# Patient Record
Sex: Female | Born: 1990 | Race: White | Hispanic: No | Marital: Married | Smoking: Never smoker
Health system: Southern US, Community
[De-identification: ages and names within clinical notes are randomized; demographics above are authoritative.]

## PROBLEM LIST (undated history)

## (undated) DIAGNOSIS — K219 Gastro-esophageal reflux disease without esophagitis: Secondary | ICD-10-CM

## (undated) DIAGNOSIS — E781 Pure hyperglyceridemia: Secondary | ICD-10-CM

## (undated) DIAGNOSIS — B019 Varicella without complication: Secondary | ICD-10-CM

## (undated) DIAGNOSIS — B86 Scabies: Secondary | ICD-10-CM

## (undated) DIAGNOSIS — E559 Vitamin D deficiency, unspecified: Secondary | ICD-10-CM

## (undated) DIAGNOSIS — N939 Abnormal uterine and vaginal bleeding, unspecified: Secondary | ICD-10-CM

## (undated) DIAGNOSIS — L0293 Carbuncle, unspecified: Secondary | ICD-10-CM

## (undated) HISTORY — DX: Vitamin D deficiency, unspecified: E55.9

## (undated) HISTORY — PX: NO PAST SURGERIES: SHX2092

## (undated) HISTORY — DX: Morbid (severe) obesity due to excess calories: E66.01

## (undated) HISTORY — DX: Abnormal uterine and vaginal bleeding, unspecified: N93.9

## (undated) HISTORY — DX: Pure hyperglyceridemia: E78.1

## (undated) HISTORY — DX: Carbuncle, unspecified: L02.93

## (undated) HISTORY — DX: Varicella without complication: B01.9

## (undated) HISTORY — DX: Gastro-esophageal reflux disease without esophagitis: K21.9

## (undated) HISTORY — DX: Scabies: B86

## (undated) HISTORY — PX: WISDOM TOOTH EXTRACTION: SHX21

---
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
## 2009-08-01 ENCOUNTER — Emergency Department (HOSPITAL_COMMUNITY): Admission: EM | Admit: 2009-08-01 | Discharge: 2009-08-01 | Payer: Self-pay | Admitting: Emergency Medicine

## 2009-08-02 ENCOUNTER — Ambulatory Visit: Payer: Self-pay | Admitting: Vascular Surgery

## 2009-08-02 ENCOUNTER — Encounter (INDEPENDENT_AMBULATORY_CARE_PROVIDER_SITE_OTHER): Payer: Self-pay | Admitting: Emergency Medicine

## 2009-08-02 ENCOUNTER — Emergency Department (HOSPITAL_COMMUNITY): Admission: EM | Admit: 2009-08-02 | Discharge: 2009-08-02 | Payer: Self-pay | Admitting: Emergency Medicine

## 2009-12-03 ENCOUNTER — Emergency Department (HOSPITAL_COMMUNITY): Admission: EM | Admit: 2009-12-03 | Discharge: 2009-12-03 | Payer: Self-pay | Admitting: Family Medicine

## 2009-12-29 ENCOUNTER — Emergency Department (HOSPITAL_COMMUNITY): Admission: EM | Admit: 2009-12-29 | Discharge: 2009-12-29 | Payer: Self-pay | Admitting: Emergency Medicine

## 2010-01-14 ENCOUNTER — Emergency Department (HOSPITAL_COMMUNITY): Admission: EM | Admit: 2010-01-14 | Discharge: 2010-01-14 | Payer: Self-pay | Admitting: Emergency Medicine

## 2010-06-30 ENCOUNTER — Encounter: Payer: Self-pay | Admitting: Orthopaedic Surgery

## 2010-08-23 LAB — DIFFERENTIAL
Eosinophils Absolute: 0.1 10*3/uL (ref 0.0–0.7)
Lymphs Abs: 2.2 10*3/uL (ref 0.7–4.0)
Neutro Abs: 3 10*3/uL (ref 1.7–7.7)
Neutrophils Relative %: 53 % (ref 43–77)

## 2010-08-23 LAB — CBC
HCT: 36 % (ref 36.0–46.0)
Hemoglobin: 11.8 g/dL — ABNORMAL LOW (ref 12.0–15.0)
MCH: 28 pg (ref 26.0–34.0)
MCHC: 32.8 g/dL (ref 30.0–36.0)
MCV: 85.5 fL (ref 78.0–100.0)

## 2010-08-23 LAB — COMPREHENSIVE METABOLIC PANEL
ALT: 14 U/L (ref 0–35)
BUN: 5 mg/dL — ABNORMAL LOW (ref 6–23)
CO2: 27 mEq/L (ref 19–32)
Calcium: 9.1 mg/dL (ref 8.4–10.5)
Creatinine, Ser: 0.52 mg/dL (ref 0.4–1.2)
GFR calc non Af Amer: >60 mL/min (ref >60)
Glucose, Bld: 95 mg/dL (ref 70–99)
Sodium: 139 mEq/L (ref 135–145)

## 2010-08-23 LAB — URINALYSIS, ROUTINE W REFLEX MICROSCOPIC
Bilirubin Urine: NEGATIVE
Glucose, UA: NEGATIVE mg/dL
Hgb urine dipstick: NEGATIVE
Ketones, ur: NEGATIVE mg/dL
Protein, ur: NEGATIVE mg/dL
pH: 7.5 (ref 5.0–8.0)

## 2010-08-23 LAB — LIPASE, BLOOD: Lipase: 23 U/L (ref 11–59)

## 2010-08-23 LAB — POCT PREGNANCY, URINE: Preg Test, Ur: NEGATIVE

## 2010-08-24 LAB — POCT PREGNANCY, URINE: Preg Test, Ur: NEGATIVE

## 2010-08-24 LAB — POCT I-STAT, CHEM 8
BUN: 8 mg/dL (ref 6–23)
Chloride: 104 mEq/L (ref 96–112)
Sodium: 141 mEq/L (ref 135–145)
TCO2: 27 mmol/L (ref 0–100)

## 2010-08-24 LAB — CBC
MCH: 29.2 pg (ref 26.0–34.0)
MCHC: 34.1 g/dL (ref 30.0–36.0)
Platelets: 268 10*3/uL (ref 150–400)

## 2010-08-24 LAB — LIPASE, BLOOD: Lipase: 26 U/L (ref 11–59)

## 2010-08-24 LAB — URINALYSIS, ROUTINE W REFLEX MICROSCOPIC
Bilirubin Urine: NEGATIVE
Ketones, ur: NEGATIVE mg/dL
Nitrite: NEGATIVE
Specific Gravity, Urine: 1.016 (ref 1.005–1.030)
Urobilinogen, UA: 0.2 mg/dL (ref 0.0–1.0)

## 2010-08-24 LAB — HEPATIC FUNCTION PANEL
Albumin: 3.8 g/dL (ref 3.5–5.2)
Total Protein: 6.6 g/dL (ref 6.0–8.3)

## 2010-08-24 LAB — DIFFERENTIAL
Basophils Absolute: 0 10*3/uL (ref 0.0–0.1)
Basophils Relative: 1 % (ref 0–1)
Eosinophils Absolute: 0.1 10*3/uL (ref 0.0–0.7)
Neutrophils Relative %: 41 % — ABNORMAL LOW (ref 43–77)

## 2010-08-25 LAB — POCT URINALYSIS DIP (DEVICE)
Bilirubin Urine: NEGATIVE
Ketones, ur: NEGATIVE mg/dL
Protein, ur: NEGATIVE mg/dL

## 2010-08-25 LAB — POCT PREGNANCY, URINE: Preg Test, Ur: NEGATIVE

## 2010-08-30 LAB — DIFFERENTIAL
Basophils Absolute: 0 10*3/uL (ref 0.0–0.1)
Basophils Relative: 0 % (ref 0–1)
Eosinophils Absolute: 0 10*3/uL (ref 0.0–0.7)
Eosinophils Relative: 0 % (ref 0–5)
Monocytes Absolute: 0.5 10*3/uL (ref 0.1–1.0)
Neutro Abs: 7.1 10*3/uL (ref 1.7–7.7)

## 2010-08-30 LAB — URINALYSIS, ROUTINE W REFLEX MICROSCOPIC
Bilirubin Urine: NEGATIVE
Bilirubin Urine: NEGATIVE
Hgb urine dipstick: NEGATIVE
Ketones, ur: NEGATIVE mg/dL
Nitrite: NEGATIVE
Nitrite: NEGATIVE
Specific Gravity, Urine: 1.013 (ref 1.005–1.030)
Urobilinogen, UA: 0.2 mg/dL (ref 0.0–1.0)
Urobilinogen, UA: 1 mg/dL (ref 0.0–1.0)
pH: 7.5 (ref 5.0–8.0)

## 2010-08-30 LAB — BASIC METABOLIC PANEL
CO2: 27 mEq/L (ref 19–32)
Calcium: 9.6 mg/dL (ref 8.4–10.5)
Chloride: 106 mEq/L (ref 96–112)
Glucose, Bld: 86 mg/dL (ref 70–99)
Sodium: 139 mEq/L (ref 135–145)

## 2010-08-30 LAB — CBC
HCT: 36.5 % (ref 36.0–46.0)
Hemoglobin: 12.1 g/dL (ref 12.0–15.0)
MCHC: 33.2 g/dL (ref 30.0–36.0)
MCV: 84.1 fL (ref 78.0–100.0)
RDW: 14.3 % (ref 11.5–15.5)

## 2010-08-30 LAB — D-DIMER, QUANTITATIVE: D-Dimer, Quant: <0.22 ug/mL-FEU (ref 0.00–0.48)

## 2010-08-30 LAB — URINE CULTURE
Colony Count: 80000
Colony Count: NO GROWTH

## 2010-08-30 LAB — RAPID URINE DRUG SCREEN, HOSP PERFORMED
Barbiturates: NOT DETECTED
Benzodiazepines: NOT DETECTED

## 2010-08-30 LAB — POCT CARDIAC MARKERS: Troponin i, poc: <0.05 ng/mL (ref 0.00–0.09)

## 2013-03-17 ENCOUNTER — Other Ambulatory Visit: Payer: Self-pay | Admitting: Family Medicine

## 2013-03-17 NOTE — Telephone Encounter (Signed)
Medication refilled per protocol. 

## 2013-04-12 ENCOUNTER — Other Ambulatory Visit: Payer: Self-pay | Admitting: Family Medicine

## 2013-05-23 ENCOUNTER — Other Ambulatory Visit: Payer: Self-pay | Admitting: Family Medicine

## 2013-05-24 NOTE — Telephone Encounter (Signed)
Medication refilled per protocol. 

## 2013-06-22 ENCOUNTER — Encounter: Payer: Self-pay | Admitting: Family Medicine

## 2013-06-22 ENCOUNTER — Ambulatory Visit (INDEPENDENT_AMBULATORY_CARE_PROVIDER_SITE_OTHER): Payer: BC Managed Care – PPO | Admitting: Family Medicine

## 2013-06-22 VITALS — BP 110/70 | HR 78 | Temp 98.0°F | Resp 20 | Ht 66.0 in | Wt 287.0 lb

## 2013-06-22 DIAGNOSIS — K219 Gastro-esophageal reflux disease without esophagitis: Secondary | ICD-10-CM

## 2013-06-22 DIAGNOSIS — Z309 Encounter for contraceptive management, unspecified: Secondary | ICD-10-CM

## 2013-06-22 HISTORY — DX: Morbid (severe) obesity due to excess calories: E66.01

## 2013-06-22 MED ORDER — NORGESTIM-ETH ESTRAD TRIPHASIC 0.18/0.215/0.25 MG-35 MCG PO TABS: ORAL_TABLET | ORAL | Status: DC

## 2013-06-22 MED ORDER — OMEPRAZOLE 40 MG PO CPDR: DELAYED_RELEASE_CAPSULE | ORAL | Status: DC

## 2013-06-22 NOTE — Patient Instructions (Signed)
Continue current medications F/U 4 weeks - For Physical PAP Smear/ Fasting

## 2013-06-22 NOTE — Progress Notes (Signed)
   Subjective:    Patient ID: Jenna Bailey, female    DOB: May 21, 1991, 23 y.o.   MRN: [id number]  HPI  Patient here to followup medications. She has no specific concerns today. She's on birth control as well as omeprazole for acid reflux. She does not have any difficulties with the medications. Her last Pap smear was greater than one year ago. She is sexually active with one partner. Her last menstrual period was one week ago   Review of Systems  GEN- denies fatigue, fever, weight loss,weakness, recent illness HEENT- denies eye drainage, change in vision, nasal discharge, CVS- denies chest pain, palpitations RESP- denies SOB, cough, wheeze Neuro- denies headache, dizziness, syncope, seizure activity      Objective:   Physical Exam GEN- NAD, alert and oriented x3, obese CVS- RRR, no murmur RESP-CTAB EXT- No edema Pulses- Radial 2+        Assessment & Plan:

## 2013-06-22 NOTE — Assessment & Plan Note (Signed)
Birth control refills. She will schedule physical with a Pap smear in 4 weeks time.

## 2013-06-22 NOTE — Assessment & Plan Note (Signed)
Continue omeprazole 

## 2013-07-20 ENCOUNTER — Other Ambulatory Visit: Payer: Self-pay | Admitting: Family Medicine

## 2013-07-20 ENCOUNTER — Encounter: Payer: Self-pay | Admitting: Family Medicine

## 2013-07-20 ENCOUNTER — Ambulatory Visit (INDEPENDENT_AMBULATORY_CARE_PROVIDER_SITE_OTHER): Payer: BC Managed Care – PPO | Admitting: Family Medicine

## 2013-07-20 VITALS — BP 110/70 | HR 90 | Temp 98.0°F | Resp 18 | Ht 65.5 in | Wt 288.0 lb

## 2013-07-20 DIAGNOSIS — Z113 Encounter for screening for infections with a predominantly sexual mode of transmission: Secondary | ICD-10-CM

## 2013-07-20 DIAGNOSIS — Z23 Encounter for immunization: Secondary | ICD-10-CM

## 2013-07-20 DIAGNOSIS — Z124 Encounter for screening for malignant neoplasm of cervix: Secondary | ICD-10-CM

## 2013-07-20 DIAGNOSIS — Z0000 Encounter for general adult medical examination without abnormal findings: Secondary | ICD-10-CM

## 2013-07-20 DIAGNOSIS — R51 Headache: Secondary | ICD-10-CM

## 2013-07-20 DIAGNOSIS — R519 Headache, unspecified: Secondary | ICD-10-CM

## 2013-07-20 LAB — CBC WITH DIFFERENTIAL/PLATELET
Basophils Absolute: 0 10*3/uL (ref 0.0–0.1)
Basophils Relative: 0 % (ref 0–1)
EOS ABS: 0.1 10*3/uL (ref 0.0–0.7)
Eosinophils Relative: 1 % (ref 0–5)
HEMATOCRIT: 36.5 % (ref 36.0–46.0)
HEMOGLOBIN: 12 g/dL (ref 12.0–15.0)
LYMPHS ABS: 3.1 10*3/uL (ref 0.7–4.0)
Lymphocytes Relative: 38 % (ref 12–46)
MCH: 26.4 pg (ref 26.0–34.0)
MCHC: 32.9 g/dL (ref 30.0–36.0)
MCV: 80.4 fL (ref 78.0–100.0)
MONO ABS: 0.5 10*3/uL (ref 0.1–1.0)
MONOS PCT: 6 % (ref 3–12)
NEUTROS PCT: 55 % (ref 43–77)
Neutro Abs: 4.4 10*3/uL (ref 1.7–7.7)
Platelets: 367 10*3/uL (ref 150–400)
RBC: 4.54 MIL/uL (ref 3.87–5.11)
RDW: 14.5 % (ref 11.5–15.5)
WBC: 8.1 10*3/uL (ref 4.0–10.5)

## 2013-07-20 LAB — COMPREHENSIVE METABOLIC PANEL
ALBUMIN: 3.7 g/dL (ref 3.5–5.2)
ALT: 18 U/L (ref 0–35)
AST: 16 U/L (ref 0–37)
Alkaline Phosphatase: 65 U/L (ref 39–117)
BILIRUBIN TOTAL: 0.4 mg/dL (ref 0.2–1.2)
BUN: 9 mg/dL (ref 6–23)
CO2: 27 meq/L (ref 19–32)
Calcium: 9.1 mg/dL (ref 8.4–10.5)
Chloride: 102 mEq/L (ref 96–112)
Creat: 0.57 mg/dL (ref 0.50–1.10)
GLUCOSE: 84 mg/dL (ref 70–99)
Potassium: 4.8 mEq/L (ref 3.5–5.3)
SODIUM: 137 meq/L (ref 135–145)
TOTAL PROTEIN: 6.6 g/dL (ref 6.0–8.3)

## 2013-07-20 LAB — LIPID PANEL
CHOLESTEROL: 177 mg/dL (ref 0–200)
HDL: 36 mg/dL — AB (ref >39)
LDL Cholesterol: 90 mg/dL (ref 0–99)
TRIGLYCERIDES: 254 mg/dL — AB (ref <150)
Total CHOL/HDL Ratio: 4.9 Ratio
VLDL: 51 mg/dL — AB (ref 0–40)

## 2013-07-20 LAB — RPR

## 2013-07-20 LAB — WET PREP FOR TRICH, YEAST, CLUE
Trich, Wet Prep: NONE SEEN
Yeast Wet Prep HPF POC: NONE SEEN

## 2013-07-20 LAB — HIV ANTIBODY (ROUTINE TESTING W REFLEX): HIV: NONREACTIVE

## 2013-07-20 NOTE — Patient Instructions (Signed)
I recommend eye visit once a year I recommend dental visit every 6 months Goal is to  Exercise 30 minutes 5 days a week We will send a letter with lab results  Okay to use Ibuprofen every 6 hours as needed for headache F/U in 1 year for physical and medications

## 2013-07-20 NOTE — Progress Notes (Signed)
Patient ID: Jenna Bailey, female   DOB: [birth year], 24 y.o.   MRN: [id number]     Subjective:    Patient ID: Jenna Bailey, female    DOB: [birth year], 23 y.o.   MRN: [id number]  Patient presents for Annual Exam and headaches   patient here for a no exam. Her last Pap smear was either one to 2 years ago was normal. Her last menstrual period was one week ago. She's currently on birth control and acid reflux medication. She's not had any difficulties with her menses. She agreed to STD check today. She has had headaches for the past week almost daily. She does take Tylenol which relieves the headache. She does have some mild nausea associated but no photophobia or phonophobia. 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 She's also tried to decrease her caffeine use. She's not exercising on a regular basis but has started walking some with her dog.    Review Of Systems:  GEN- denies fatigue, fever, weight loss,weakness, recent illness HEENT- denies eye drainage, change in vision, nasal discharge, CVS- denies chest pain, palpitations RESP- denies SOB, cough, wheeze ABD- denies N/V, change in stools, abd pain GU- denies dysuria, hematuria, dribbling, incontinence MSK- denies joint pain, muscle aches, injury Neuro- + headache, dizziness, syncope, seizure activity       Objective:    BP 110/70  Pulse 90  Temp(Src) 98 F (36.7 C) (Oral)  Resp 18  Ht 5' 5.5" (1.664 m)  Wt 288 lb (130.636 kg)  BMI 47.18 kg/m2  LMP 07/16/2013 GEN- NAD, alert and oriented x3, obese HEENT- PERRL, EOMI, non injected sclera, pink conjunctiva, MMM, oropharynx clear, fundus benign, TM clear bilat Neck- Supple, no thyromegaly Breast- normal symmetry, no nipple inversion,no nipple drainage, no nodules or lumps felt Nodes- no axillary nodes CVS- RRR, no murmur RESP-CTAB ABD-NABS,soft,NT,ND GU- normal external  genitalia, vaginal mucosa pink and moist, cervix visualized no growth, + blood form os, no discharge, no CMT, no ovarian masses, uterus normal size EXT- No edema Pulses- Radial, DP- 2+ Neuro-CNII-XII in tact, no deficits       Assessment & Plan:      Problem List Items Addressed This Visit   Morbid obesity - discussed importance of weight loss, healthy eating,    Relevant Orders      Lipid panel   Headache    Other Visit Diagnoses   Routine general medical examination at a health care facility    -  Primary  - PAP Smear done, TDAP given, STD screen    Relevant Orders       CBC with Differential       Comprehensive metabolic panel    Screening for malignant neoplasm of the cervix        Relevant Orders       PAP, ThinPrep ASCUS Rflx HPV Rflx Type    Screen for STD (sexually transmitted disease)        Relevant Orders       WET PREP FOR TRICH, YEAST, CLUE       GC/chlamydia probe amp, genital       HIV Antibody       RPR    Need for prophylactic vaccination with combined diphtheria-tetanus-pertussis (DTP) vaccine        Relevant Orders       Tdap vaccine greater than or equal to 7yo  IM (Completed)       Note: This dictation was prepared with Dragon dictation along with smaller phrase technology. Any transcriptional errors that result from this process are unintentional.

## 2013-07-21 LAB — PAP THINPREP ASCUS RFLX HPV RFLX TYPE

## 2013-07-21 LAB — GC/CHLAMYDIA PROBE AMP
CT Probe RNA: NEGATIVE
GC Probe RNA: NEGATIVE

## 2013-07-27 ENCOUNTER — Encounter: Payer: Self-pay | Admitting: *Deleted

## 2014-03-02 ENCOUNTER — Telehealth: Payer: Self-pay | Admitting: *Deleted

## 2014-03-02 NOTE — Telephone Encounter (Signed)
Received fax requesting PA on Prilosec.   PA submitted.

## 2014-03-08 NOTE — Telephone Encounter (Signed)
Received PA determination.   PA approved.   02/09/2014- 08/29/2014.  Case ID: [id number]

## 2014-06-16 ENCOUNTER — Other Ambulatory Visit: Payer: Self-pay | Admitting: Family Medicine

## 2014-06-16 NOTE — Telephone Encounter (Signed)
Refill denied.   Patient has been discharged from practice.

## 2014-06-26 ENCOUNTER — Other Ambulatory Visit: Payer: Self-pay | Admitting: Family Medicine

## 2014-06-26 NOTE — Telephone Encounter (Signed)
Refill appropriate and filled per protocol. 

## 2014-07-20 ENCOUNTER — Other Ambulatory Visit: Payer: Self-pay | Admitting: Family Medicine

## 2014-07-20 NOTE — Telephone Encounter (Signed)
Refill appropriate and filled per protocol. 

## 2014-07-21 ENCOUNTER — Encounter: Payer: BC Managed Care – PPO | Admitting: Family Medicine

## 2014-07-26 ENCOUNTER — Encounter: Payer: Self-pay | Admitting: Family Medicine

## 2014-07-26 ENCOUNTER — Ambulatory Visit (INDEPENDENT_AMBULATORY_CARE_PROVIDER_SITE_OTHER): Payer: BLUE CROSS/BLUE SHIELD | Admitting: Family Medicine

## 2014-07-26 VITALS — BP 124/70 | HR 78 | Temp 98.2°F | Resp 16 | Ht 66.0 in | Wt 310.0 lb

## 2014-07-26 DIAGNOSIS — Z0000 Encounter for general adult medical examination without abnormal findings: Secondary | ICD-10-CM

## 2014-07-26 DIAGNOSIS — Z124 Encounter for screening for malignant neoplasm of cervix: Secondary | ICD-10-CM

## 2014-07-26 DIAGNOSIS — L0293 Carbuncle, unspecified: Secondary | ICD-10-CM

## 2014-07-26 DIAGNOSIS — Z23 Encounter for immunization: Secondary | ICD-10-CM

## 2014-07-26 DIAGNOSIS — K219 Gastro-esophageal reflux disease without esophagitis: Secondary | ICD-10-CM

## 2014-07-26 DIAGNOSIS — L0292 Furuncle, unspecified: Secondary | ICD-10-CM

## 2014-07-26 DIAGNOSIS — Z3041 Encounter for surveillance of contraceptive pills: Secondary | ICD-10-CM

## 2014-07-26 HISTORY — DX: Furuncle, unspecified: L02.92

## 2014-07-26 HISTORY — DX: Carbuncle, unspecified: L02.93

## 2014-07-26 LAB — COMPREHENSIVE METABOLIC PANEL
ALBUMIN: 3.7 g/dL (ref 3.5–5.2)
ALK PHOS: 71 U/L (ref 39–117)
ALT: 19 U/L (ref 0–35)
AST: 18 U/L (ref 0–37)
BILIRUBIN TOTAL: 0.5 mg/dL (ref 0.2–1.2)
BUN: 8 mg/dL (ref 6–23)
CO2: 26 mEq/L (ref 19–32)
Calcium: 9 mg/dL (ref 8.4–10.5)
Chloride: 101 mEq/L (ref 96–112)
Creat: 0.54 mg/dL (ref 0.50–1.10)
GLUCOSE: 76 mg/dL (ref 70–99)
POTASSIUM: 4.3 meq/L (ref 3.5–5.3)
SODIUM: 139 meq/L (ref 135–145)
TOTAL PROTEIN: 6.8 g/dL (ref 6.0–8.3)

## 2014-07-26 LAB — CBC WITH DIFFERENTIAL/PLATELET
BASOS PCT: 0 % (ref 0–1)
Basophils Absolute: 0 10*3/uL (ref 0.0–0.1)
Eosinophils Absolute: 0.1 10*3/uL (ref 0.0–0.7)
Eosinophils Relative: 1 % (ref 0–5)
HEMATOCRIT: 37.1 % (ref 36.0–46.0)
Hemoglobin: 11.7 g/dL — ABNORMAL LOW (ref 12.0–15.0)
LYMPHS PCT: 39 % (ref 12–46)
Lymphs Abs: 3.7 10*3/uL (ref 0.7–4.0)
MCH: 24.6 pg — AB (ref 26.0–34.0)
MCHC: 31.5 g/dL (ref 30.0–36.0)
MCV: 78.1 fL (ref 78.0–100.0)
MONO ABS: 0.6 10*3/uL (ref 0.1–1.0)
MONOS PCT: 6 % (ref 3–12)
MPV: 8.9 fL (ref 8.6–12.4)
NEUTROS ABS: 5.1 10*3/uL (ref 1.7–7.7)
Neutrophils Relative %: 54 % (ref 43–77)
PLATELETS: 397 10*3/uL (ref 150–400)
RBC: 4.75 MIL/uL (ref 3.87–5.11)
RDW: 14.9 % (ref 11.5–15.5)
WBC: 9.5 10*3/uL (ref 4.0–10.5)

## 2014-07-26 LAB — LIPID PANEL
Cholesterol: 177 mg/dL (ref 0–200)
HDL: 41 mg/dL (ref >39)
LDL CALC: 81 mg/dL (ref 0–99)
TRIGLYCERIDES: 276 mg/dL — AB (ref <150)
Total CHOL/HDL Ratio: 4.3 Ratio
VLDL: 55 mg/dL — AB (ref 0–40)

## 2014-07-26 MED ORDER — SULFAMETHOXAZOLE-TRIMETHOPRIM 800-160 MG PO TABS: 1.0000 | ORAL_TABLET | Freq: Two times a day (BID) | ORAL | Status: DC

## 2014-07-26 NOTE — Addendum Note (Signed)
Addended by: Phillips OdorSIX, CHRISTINA H on: 07/26/2014 12:42 PM   Modules accepted: Orders

## 2014-07-26 NOTE — Patient Instructions (Addendum)
I recommend eye visit once a year I recommend dental visit every 6 months Goal is to  Exercise 30 minutes 5 days a week We will send a letter with lab results  If normal we will go 2-3 years before next PAP Smear Take antibiotics as prescribed for boils Use anti-bacterial soap F/U 1 year for medications/Physical

## 2014-07-26 NOTE — Assessment & Plan Note (Signed)
Bactrim DS x 7 days Change to antibacterial soap Decrease shaving in axilla Discussed back up method while on antibiotics for contraception

## 2014-07-26 NOTE — Progress Notes (Signed)
Patient ID: Jenna Bailey, female   DOB: 1990/09/07, 24 y.o.   MRN: [id number]   Subjective:    Patient ID: Jenna Bailey, female    DOB: 1990/09/07, 24 y.o.   MRN: [id number]  Patient presents for CPE- no PAP and Discuss Boils  patient here for physical exam. She is concerned about recurrent boils that she's been getting beneath her axilla. She's also had a few on her abdomen in her breast region. She's tried changing to NATURAL soaps with no improvement. Since our last visit she is actually gained 22 pounds. She states that she was eating a lot when she lost her job there was a lot of stressors at home. Note she was recently married in October. She is taking her birth control as prescribed as well as her omeprazole. She would like to have a flu shot today. She is due for fasting labs. She did have a Pap smear last year which showed benign repaired of changes her last initial. Started last Thursday    Review Of Systems:  GEN- denies fatigue, fever, weight loss,weakness, recent illness HEENT- denies eye drainage, change in vision, nasal discharge, CVS- denies chest pain, palpitations RESP- denies SOB, cough, wheeze ABD- denies N/V, change in stools, abd pain GU- denies dysuria, hematuria, dribbling, incontinence MSK- denies joint pain, muscle aches, injury Neuro- + occasional  headache, dizziness, syncope, seizure activity       Objective:    BP 124/70 mmHg  Pulse 78  Temp(Src) 98.2 F (36.8 C) (Oral)  Resp 16  Ht 5\' 6"  (1.676 m)  Wt 310 lb (140.615 kg)  BMI 50.06 kg/m2  LMP 07/20/2014 (Approximate) GEN- NAD, alert and oriented x3,obese HEENT- PERRL, EOMI, non injected sclera, pink conjunctiva, MMM, oropharynx clear, TM clear bilat, wears glasses Neck- Supple, no thyromegaly Breast- normal symmetry, no nipple inversion,no nipple drainage, no nodules or lumps felt Nodes- no axillary nodes CVS- RRR, no murmur RESP-CTAB ABD-NABS,soft,NT,ND GU- normal external genitalia,  vaginal mucosa pink and moist, cervix visualized no growth, + blood form os, minimal thin clear discharge, no CMT, no ovarian masses, uterus difficult to palpate entirely due to habitus Skin- multiple small pustules bilat axilla, small boil < dime size below left axilla, , erythematous area with scab at center on right breast 7 o'clock position, no indurated or large fluctuant lesions, scarbs on abdomen, buttocks EXT- No edema Pulses- Radial, DP- 2+        Assessment & Plan:      Problem List Items Addressed This Visit      Unprioritized   Recurrent boils   Relevant Medications   sulfamethoxazole-trimethoprim (BACTRIM/SEPTRA DS) tablet 800-160 mg   Morbid obesity - Primary   GERD (gastroesophageal reflux disease)   Contraceptive management    Other Visit Diagnoses    Routine general medical examination at a health care facility        PAP done, if normal, will 2 wnl , repeat in 3 years, Flu shot, fasting labs, discussed weigth at length and concern for DM, HTN, CAD    Relevant Orders    CBC with Differential/Platelet    Comprehensive metabolic panel    Lipid panel    Papanicolaou smear for cervical cancer screening        Relevant Orders    PAP, ThinPrep ASCUS Rflx HPV Rflx Type       Note: This dictation was prepared with Dragon dictation along with smaller phrase technology. Any transcriptional errors that result from this process  are unintentional.

## 2014-07-26 NOTE — Assessment & Plan Note (Signed)
Continue OCP

## 2014-07-26 NOTE — Assessment & Plan Note (Signed)
Well controlled on PPI continue, weight loss and dietary change will help

## 2014-07-27 LAB — PAP THINPREP ASCUS RFLX HPV RFLX TYPE

## 2014-07-28 ENCOUNTER — Other Ambulatory Visit: Payer: Self-pay | Admitting: *Deleted

## 2014-07-28 DIAGNOSIS — E785 Hyperlipidemia, unspecified: Secondary | ICD-10-CM

## 2014-09-12 ENCOUNTER — Other Ambulatory Visit: Payer: Self-pay | Admitting: Family Medicine

## 2014-09-13 NOTE — Telephone Encounter (Signed)
Refill appropriate and filled per protocol. 

## 2014-09-27 ENCOUNTER — Telehealth: Payer: Self-pay | Admitting: Family Medicine

## 2014-09-27 NOTE — Telephone Encounter (Signed)
Patient is calling to see what the status of her omeprazole authorization is , please call her at [phone]

## 2014-09-28 NOTE — Telephone Encounter (Signed)
Attempting PA for Omeprazole.   Need correct pharmacy payer information.   Call placed to patient. LMTRC.

## 2014-09-28 NOTE — Telephone Encounter (Signed)
Pt called back and stated her pharmacy is [facility] [address]

## 2014-09-29 NOTE — Telephone Encounter (Signed)
Call placed to patient.   Repots that she got married and changed her name on her insurance. States that she did receive a new card from [facility]. States that she will bring it in office later in the day.

## 2014-10-03 NOTE — Telephone Encounter (Signed)
PA Submitted

## 2014-10-04 NOTE — Telephone Encounter (Signed)
Received PA determination.   PA approved 09/12/2014- 04/01/2015.  Case ID: [id number].

## 2014-11-12 ENCOUNTER — Encounter (HOSPITAL_COMMUNITY): Payer: Self-pay | Admitting: Emergency Medicine

## 2014-11-12 ENCOUNTER — Emergency Department (HOSPITAL_COMMUNITY)
Admission: EM | Admit: 2014-11-12 | Discharge: 2014-11-12 | Disposition: A | Discharge status: Home / Self Care | Payer: BLUE CROSS/BLUE SHIELD | Attending: Emergency Medicine | Admitting: Emergency Medicine

## 2014-11-12 DIAGNOSIS — L03116 Cellulitis of left lower limb: Secondary | ICD-10-CM | POA: Insufficient documentation

## 2014-11-12 DIAGNOSIS — Z79899 Other long term (current) drug therapy: Secondary | ICD-10-CM | POA: Insufficient documentation

## 2014-11-12 DIAGNOSIS — K219 Gastro-esophageal reflux disease without esophagitis: Secondary | ICD-10-CM | POA: Insufficient documentation

## 2014-11-12 MED ORDER — CEPHALEXIN 500 MG PO CAPS: 500.0000 mg | ORAL_CAPSULE | Freq: Four times a day (QID) | ORAL | Status: DC

## 2014-11-12 MED ORDER — SULFAMETHOXAZOLE-TRIMETHOPRIM 800-160 MG PO TABS
1.0000 | ORAL_TABLET | Freq: Two times a day (BID) | ORAL | Status: DC
Start: 2014-11-12 — End: 2015-03-07

## 2014-11-12 NOTE — ED Notes (Addendum)
Patient with boil on left lower leg, now with redness, warm to the touch, hurt to walk.  It is swollen.  Patient denies any long car rides or flights.

## 2014-11-12 NOTE — Discharge Instructions (Signed)

## 2014-11-12 NOTE — ED Provider Notes (Signed)
CSN: [id number]     Arrival date & time 11/12/14  1901 History  This chart was scribed for non-physician practitioner, Ladona Mow, PA-C,working with Mancel Bale, MD, by Karle Plumber, ED Scribe. This patient was seen in room TR06C/TR06C and the patient's care was started at 9:42 PM.  Chief Complaint  Patient presents with  . Abscess   Patient is a 24 y.o. female presenting with abscess. The history is provided by the patient and medical records. No language interpreter was used.  Abscess Associated symptoms: no fever, no nausea and no vomiting     HPI Comments:  ZAINEB NOWACZYK is a 24 y.o. female with PMHx of abscesses who presents to the Emergency Department complaining of an abscess to the left lateral leg that has been getting progressively worse that began four days ago. She reports associated drainage, swelling and redness of the area. She reports severe pain. She has been soaking the area in warm water with minimal relief. Denies modifying factors. Denies fever, chills, nausea or vomiting. She denies h/o DVT or PE.   Past Medical History  Diagnosis Date  . GERD (gastroesophageal reflux disease)    Past Surgical History  Procedure Laterality Date  . Wisdom tooth extraction     No family history on file. History  Substance Use Topics  . Smoking status: Never Smoker   . Smokeless tobacco: Never Used  . Alcohol Use: No   OB History    No data available     Review of Systems  Constitutional: Negative for fever and chills.  Gastrointestinal: Negative for nausea and vomiting.  Skin: Positive for color change.    Allergies  Review of patient's allergies indicates no known allergies.  Home Medications   Prior to Admission medications   Medication Sig Start Date End Date Taking? Authorizing Provider  cephALEXin (KEFLEX) 500 MG capsule Take 1 capsule (500 mg total) by mouth 4 (four) times daily. 11/12/14   Ladona Mow, PA-C  omeprazole (PRILOSEC) 40 MG capsule TAKE 1 CAPSULE BY  MOUTH EVERY DAY 07/20/14   Salley Scarlet, MD  sulfamethoxazole-trimethoprim (BACTRIM DS,SEPTRA DS) 800-160 MG per tablet Take 1 tablet by mouth 2 (two) times daily. 11/12/14   Ladona Mow, PA-C  TRINESSA, 28, 0.18/0.215/0.25 MG-35 MCG tablet TAKE 1 TABLET BY MOUTH DAILY 09/13/14   Salley Scarlet, MD   Triage Vitals: BP 120/85 mmHg  Pulse 90  Temp(Src) 98.1 F (36.7 C) (Oral)  Resp 14  SpO2 97%  LMP 10/22/2014 (Approximate) Physical Exam  Constitutional: She is oriented to person, place, and time. She appears well-developed and well-nourished.  HENT:  Head: Normocephalic and atraumatic.  Eyes: EOM are normal.  Neck: Normal range of motion.  Cardiovascular: Normal rate.   Pulmonary/Chest: Effort normal.  Musculoskeletal: Normal range of motion.  Neurological: She is alert and oriented to person, place, and time.  Skin: Skin is warm and dry.     Erythema and mild swelling noted to lateral, distal portion of tibial region on left leg. Erythema does not extend circumferentially. In the center there is an area of increased redness approximately 3 x 3 cm with papular lesion consistent with bug bite.  Psychiatric: She has a normal mood and affect. Her behavior is normal.  Nursing note and vitals reviewed.   ED Course  Procedures (including critical care time) DIAGNOSTIC STUDIES: Oxygen Saturation is 97% on RA, normal by my interpretation.   COORDINATION OF CARE: 9:47 PM- Will ultrasound area to check for indication  of incision and drainage. Pt verbalizes understanding and agrees to plan.  Medications - No data to display  Labs Review Labs Reviewed - No data to display  Imaging Review No results found.   EKG Interpretation None      MDM   Final diagnoses:  Cellulitis of left lower extremity    Redness and swelling to left lower leg consistent with cellulitis. Based on obvious source of infection from bug bite, likely infectious versus coagulopathy. No concern for DVT  based on exam and history. Based on Wells Criteria for DVT, pt has -1 pts and is in low risk group for DVT. Ultrasound placed over the area of most redness, there is no fluid collection for incision and drainage. Patient afebrile, hemodynamically stable and in no acute distress. No concern for sepsis or SIRS. Patient discharged, given a course of antibody, return precautions discussed, patient encouraged to follow-up in 2 days for wound reevaluation. Patient verbalizes understanding and agreement with this plan.  I personally performed the services described in this documentation, which was scribed in my presence. The recorded information has been reviewed and is accurate.  BP 142/75 mmHg  Pulse 102  Temp(Src) 98.1 F (36.7 C) (Oral)  Resp 18  SpO2 99%  LMP 10/22/2014 (Approximate)  Signed,  Ladona MowJoe Eddith Mentor, PA-C 12:11 AM  Patient seen and discussed with Dr. Gray BernhardtElliot Wentz, MD  Ladona MowJoe Quavis Klutz, PA-C 11/14/14 0011  Mancel BaleElliott Wentz, MD 11/14/14 [phone]

## 2014-11-12 NOTE — ED Notes (Signed)
Pt c/o redness to left lower leg. Swelling noted to area. Denies recent travel or insect bite to area.

## 2015-03-07 ENCOUNTER — Ambulatory Visit (INDEPENDENT_AMBULATORY_CARE_PROVIDER_SITE_OTHER): Payer: BLUE CROSS/BLUE SHIELD | Admitting: Family Medicine

## 2015-03-07 ENCOUNTER — Encounter: Payer: Self-pay | Admitting: Family Medicine

## 2015-03-07 VITALS — BP 138/78 | HR 62 | Temp 97.9°F | Resp 14 | Ht 66.0 in | Wt 308.0 lb

## 2015-03-07 DIAGNOSIS — E781 Pure hyperglyceridemia: Secondary | ICD-10-CM | POA: Diagnosis not present

## 2015-03-07 DIAGNOSIS — K648 Other hemorrhoids: Secondary | ICD-10-CM

## 2015-03-07 DIAGNOSIS — E785 Hyperlipidemia, unspecified: Secondary | ICD-10-CM

## 2015-03-07 DIAGNOSIS — Z23 Encounter for immunization: Secondary | ICD-10-CM

## 2015-03-07 HISTORY — DX: Pure hyperglyceridemia: E78.1

## 2015-03-07 LAB — LIPID PANEL
CHOL/HDL RATIO: 4.1 ratio (ref <=5.0)
CHOLESTEROL: 153 mg/dL (ref 125–200)
HDL: 37 mg/dL — ABNORMAL LOW (ref >=46)
LDL Cholesterol: 70 mg/dL (ref <130)
Triglycerides: 229 mg/dL — ABNORMAL HIGH (ref <150)
VLDL: 46 mg/dL — ABNORMAL HIGH (ref <30)

## 2015-03-07 NOTE — Assessment & Plan Note (Signed)
RECHECK lipids, discussed diet, given handouts on portion control, healthy eating

## 2015-03-07 NOTE — Progress Notes (Signed)
Patient ID: Jenna Bailey, female   DOB: 11-Jun-1990, 24 y.o.   MRN: [id number]   Subjective:    Patient ID: Jenna Bailey, female    DOB: [birth year], 24 y.o.   MRN: [id number]  Patient presents for Blood in Stool  patient here with rectal bleeding last week. She states that she did have some strain early in any blood in stool. She has quite poor. Small amount of bright red blood. Last Wednesday she noticed blood after bowel movement she then had an episode with a smaller on Thursday and Friday and then it resolves. States his HOUSE without since she was younger constipation.  Past couple years however she has been going on regular basis without any significant strain.  She denies any abdominal pain, no vaginal bleeding LMP in July, she stopped her OCP in August because it made her feel funny, did pregnancy test it was negative, but then started OCP back Sept 1. Now no troubles with meds     Review Of Systems:  GEN- denies fatigue, fever, weight loss,weakness, recent illness HEENT- denies eye drainage, change in vision, nasal discharge, CVS- denies chest pain, palpitations RESP- denies SOB, cough, wheeze ABD- denies N/V, change in stools, abd pain GU- denies dysuria, hematuria, dribbling, incontinence MSK- denies joint pain, muscle aches, injury Neuro- denies headache, dizziness, syncope, seizure activity       Objective:    BP 138/78 mmHg  Pulse 62  Temp(Src) 97.9 F (36.6 C) (Oral)  Resp 14  Ht  (1.676 m)  Wt 308 lb (139.708 kg)  BMI 49.74 kg/m2  LMP 01/01/2015 (Approximate) GEN- NAD, alert and oriented x3 HEENT- PERRL, EOMI, non injected sclera, pink conjunctiva, MMM, oropharynx clear CVS- RRR, no murmur RESP-CTAB ABD-NABS,soft,NT,ND Rectum- no external lesions, small palpable internal hemorrhoids, NT, no rectal fissures, FOBT neg, soft stool in vault  EXT- No edema Pulses- Radial, DP- 2+        Assessment & Plan:      Problem List Items Addressed This Visit     Morbid obesity   Hypertriglyceridemia    RECHECK lipids, discussed diet, given handouts on portion control, healthy eating        Other Visit Diagnoses    Need for prophylactic vaccination and inoculation against influenza    -  Primary    Relevant Orders    Flu Vaccine QUAD 36+ mos PF IM (Fluarix & Fluzone Quad PF) (Completed)    Internal hemorrhoid        History of constipation, discussed dietary changes, can use Miralax, no active bleeding at this time    Relevant Orders    CBC with Differential/Platelet    Hyperlipidemia           Note: This dictation was prepared with Dragon dictation along with smaller phrase technology. Any transcriptional errors that result from this process are unintentional.

## 2015-03-07 NOTE — Patient Instructions (Addendum)
Keep bowels soft Use miralax if needed for constipation 1 cap full daily We will call with lab results FLu shot given F/U 6 months   Calorie Counting for Weight Loss Calories are energy you get from the things you eat and drink. Your body uses this energy to keep you going throughout the day. The number of calories you eat affects your weight. When you eat more calories than your body needs, your body stores the extra calories as fat. When you eat fewer calories than your body needs, your body burns fat to get the energy it needs. Calorie counting means keeping track of how many calories you eat and drink each day. If you make sure to eat fewer calories than your body needs, you should lose weight. In order for calorie counting to work, you will need to eat the number of calories that are right for you in a day to lose a healthy amount of weight per week. A healthy amount of weight to lose per week is usually 1-2 lb (0.5-0.9 kg). A dietitian can determine how many calories you need in a day and give you suggestions on how to reach your calorie goal.  WHAT IS MY MY PLAN? My goal is to have 1800 calories per day.  If I have this many calories per day, I should lose around 2-3 pounds per week. WHAT DO I NEED TO KNOW ABOUT CALORIE COUNTING? In order to meet your daily calorie goal, you will need to:  Find out how many calories are in each food you would like to eat. Try to do this before you eat.  Decide how much of the food you can eat.  Write down what you ate and how many calories it had. Doing this is called keeping a food log. WHERE DO I FIND CALORIE INFORMATION? The number of calories in a food can be found on a Nutrition Facts label. Note that all the information on a label is based on a specific serving of the food. If a food does not have a Nutrition Facts label, try to look up the calories online or ask your dietitian for help. HOW DO I DECIDE HOW MUCH TO EAT? To decide how much of the  food you can eat, you will need to consider both the number of calories in one serving and the size of one serving. This information can be found on the Nutrition Facts label. If a food does not have a Nutrition Facts label, look up the information online or ask your dietitian for help. Remember that calories are listed per serving. If you choose to have more than one serving of a food, you will have to multiply the calories per serving by the amount of servings you plan to eat. For example, the label on a package of bread might say that a serving size is 1 slice and that there are 90 calories in a serving. If you eat 1 slice, you will have eaten 90 calories. If you eat 2 slices, you will have eaten 180 calories. HOW DO I KEEP A FOOD LOG? After each meal, record the following information in your food log:  What you ate.  How much of it you ate.  How many calories it had.  Then, add up your calories. Keep your food log near you, such as in a small notebook in your pocket. Another option is to use a mobile app or website. Some programs will calculate calories for you and show you  how many calories you have left each time you add an item to the log. WHAT ARE SOME CALORIE COUNTING TIPS?  Use your calories on foods and drinks that will fill you up and not leave you hungry. Some examples of this include foods like nuts and nut butters, vegetables, lean proteins, and high-fiber foods (more than 5 g fiber per serving).  Eat nutritious foods and avoid empty calories. Empty calories are calories you get from foods or beverages that do not have many nutrients, such as candy and soda. It is better to have a nutritious high-calorie food (such as an avocado) than a food with few nutrients (such as a bag of chips).  Know how many calories are in the foods you eat most often. This way, you do not have to look up how many calories they have each time you eat them.  Look out for foods that may seem like  low-calorie foods but are really high-calorie foods, such as baked goods, soda, and fat-free candy.  Pay attention to calories in drinks. Drinks such as sodas, specialty coffee drinks, alcohol, and juices have a lot of calories yet do not fill you up. Choose low-calorie drinks like water and diet drinks.  Focus your calorie counting efforts on higher calorie items. Logging the calories in a garden salad that contains only vegetables is less important than calculating the calories in a milk shake.  Find a way of tracking calories that works for you. Get creative. Most people who are successful find ways to keep track of how much they eat in a day, even if they do not count every calorie. WHAT ARE SOME PORTION CONTROL TIPS?  Know how many calories are in a serving. This will help you know how many servings of a certain food you can have.  Use a measuring cup to measure serving sizes. This is helpful when you start out. With time, you will be able to estimate serving sizes for some foods.  Take some time to put servings of different foods on your favorite plates, bowls, and cups so you know what a serving looks like.  Try not to eat straight from a bag or box. Doing this can lead to overeating. Put the amount you would like to eat in a cup or on a plate to make sure you are eating the right portion.  Use smaller plates, glasses, and bowls to prevent overeating. This is a quick and easy way to practice portion control. If your plate is smaller, less food can fit on it.  Try not to multitask while eating, such as watching TV or using your computer. If it is time to eat, sit down at a table and enjoy your food. Doing this will help you to start recognizing when you are full. It will also make you more aware of what and how much you are eating. HOW CAN I CALORIE COUNT WHEN EATING OUT?  Ask for smaller portion sizes or child-sized portions.  Consider sharing an entree and sides instead of getting  your own entree.  If you get your own entree, eat only half. Ask for a box at the beginning of your meal and put the rest of your entree in it so you are not tempted to eat it.  Look for the calories on the menu. If calories are listed, choose the lower calorie options.  Choose dishes that include vegetables, fruits, whole grains, low-fat dairy products, and lean protein. Focusing on smart food choices  from each of the 5 food groups can help you stay on track at restaurants.  Choose items that are boiled, broiled, grilled, or steamed.  Choose water, milk, unsweetened iced tea, or other drinks without added sugars. If you want an alcoholic beverage, choose a lower calorie option. For example, a regular margarita can have up to 700 calories and a glass of wine has around 150.  Stay away from items that are buttered, battered, fried, or served with cream sauce. Items labeled "crispy" are usually fried, unless stated otherwise.  Ask for dressings, sauces, and syrups on the side. These are usually very high in calories, so do not eat much of them.  Watch out for salads. Many people think salads are a healthy option, but this is often not the case. Many salads come with bacon, fried chicken, lots of cheese, fried chips, and dressing. All of these items have a lot of calories. If you want a salad, choose a garden salad and ask for grilled meats or steak. Ask for the dressing on the side, or ask for olive oil and vinegar or lemon to use as dressing.  Estimate how many servings of a food you are given. For example, a serving of cooked rice is  cup or about the size of half a tennis ball or one cupcake wrapper. Knowing serving sizes will help you be aware of how much food you are eating at restaurants. The list below tells you how big or small some common portion sizes are based on everyday objects.  1 oz--4 stacked dice.  3 oz--1 deck of cards.  1 tsp--1 dice.  1 Tbsp-- a Ping-Pong ball.  2  Tbsp--1 Ping-Pong ball.   cup--1 tennis ball or 1 cupcake wrapper.  1 cup--1 baseball. Document Released: 05/26/2005 Document Revised: 10/10/2013 Document Reviewed: 03/31/2013 [facility] Patient Information 2015 [place], [place]. This information is not intended to replace advice given to you by your health care provider. Make sure you discuss any questions you have with your health care provider.

## 2015-03-08 LAB — CBC WITH DIFFERENTIAL/PLATELET
BASOS ABS: 0 10*3/uL (ref 0.0–0.1)
BASOS PCT: 0 % (ref 0–1)
EOS ABS: 0.1 10*3/uL (ref 0.0–0.7)
Eosinophils Relative: 1 % (ref 0–5)
HCT: 36.9 % (ref 36.0–46.0)
Hemoglobin: 11.8 g/dL — ABNORMAL LOW (ref 12.0–15.0)
Lymphocytes Relative: 31 % (ref 12–46)
Lymphs Abs: 4 10*3/uL (ref 0.7–4.0)
MCH: 25.2 pg — AB (ref 26.0–34.0)
MCHC: 32 g/dL (ref 30.0–36.0)
MCV: 78.7 fL (ref 78.0–100.0)
MONOS PCT: 6 % (ref 3–12)
MPV: 9.4 fL (ref 8.6–12.4)
Monocytes Absolute: 0.8 10*3/uL (ref 0.1–1.0)
NEUTROS PCT: 62 % (ref 43–77)
Neutro Abs: 8.1 10*3/uL — ABNORMAL HIGH (ref 1.7–7.7)
PLATELETS: 400 10*3/uL (ref 150–400)
RBC: 4.69 MIL/uL (ref 3.87–5.11)
RDW: 15.2 % (ref 11.5–15.5)
WBC: 13 10*3/uL — AB (ref 4.0–10.5)

## 2015-03-12 ENCOUNTER — Encounter: Payer: Self-pay | Admitting: Family Medicine

## 2015-03-12 ENCOUNTER — Encounter: Payer: Self-pay | Admitting: Physician Assistant

## 2015-03-12 ENCOUNTER — Ambulatory Visit (INDEPENDENT_AMBULATORY_CARE_PROVIDER_SITE_OTHER): Payer: BLUE CROSS/BLUE SHIELD | Admitting: Physician Assistant

## 2015-03-12 VITALS — BP 124/80 | HR 96 | Temp 98.3°F | Resp 18 | Wt 303.0 lb

## 2015-03-12 DIAGNOSIS — J029 Acute pharyngitis, unspecified: Secondary | ICD-10-CM | POA: Diagnosis not present

## 2015-03-12 DIAGNOSIS — B349 Viral infection, unspecified: Secondary | ICD-10-CM | POA: Diagnosis not present

## 2015-03-12 DIAGNOSIS — J988 Other specified respiratory disorders: Secondary | ICD-10-CM

## 2015-03-12 DIAGNOSIS — B9789 Other viral agents as the cause of diseases classified elsewhere: Secondary | ICD-10-CM

## 2015-03-12 LAB — RAPID STREP SCREEN (MED CTR MEBANE ONLY): Streptococcus, Group A Screen (Direct): NEGATIVE

## 2015-03-12 NOTE — Progress Notes (Signed)
    Patient ID: Jenna Bailey MRN: [id number], DOB: Jul 24, 1990, 24 y.o. Date of Encounter: 03/12/2015, 2:46 PM    Chief Complaint:  Chief Complaint  Patient presents with  . sick x 1 day    sore throat, congestion, ears hurt, fever     HPI: 24 y.o. year old white female presents with above symptoms.  Mild symptoms started yesterday. Headache, cough. Says that she feels congested in her head and nose but is not blowing anything from her nose. Reports subjective fever but has not checked her thermometer. Temperature here today 98.3. Mild sore throat, mild ear ache. No other complaints or concerns.    Home Meds:   Outpatient Prescriptions Prior to Visit  Medication Sig Dispense Refill  . omeprazole (PRILOSEC) 40 MG capsule TAKE 1 CAPSULE BY MOUTH EVERY DAY 90 capsule 3  . TRINESSA, 28, 0.18/0.215/0.25 MG-35 MCG tablet TAKE 1 TABLET BY MOUTH DAILY 84 tablet 3   No facility-administered medications prior to visit.    Allergies: No Known Allergies    Review of Systems: See HPI for pertinent ROS. All other ROS negative.    Physical Exam: Blood pressure 124/80, pulse 96, temperature 98.3 F (36.8 C), temperature source Oral, resp. rate 18, weight 303 lb (137.44 kg), last menstrual period 03/11/2015., Body mass index is 48.93 kg/(m^2). General: Severely obese white female.  Appears in no acute distress. HEENT: Normocephalic, atraumatic, eyes without discharge, sclera non-icteric, nares are without discharge. Bilateral auditory canals clear, TM's are without perforation, pearly grey and translucent with reflective cone of light bilaterally. Oral cavity moist, posterior pharynx without exudate, erythema, peritonsillar abscess. Throat appears normal with no erythema or other abnormal findings  Neck: Supple. No thyromegaly. No lymphadenopathy. I feel no enlarged nodes and she reports that there is no tenderness with palpation of any lymph nodes in the neck. Lungs: Clear bilaterally to  auscultation without wheezes, rales, or rhonchi. Breathing is unlabored. Heart: Regular rhythm. No murmurs, rubs, or gallops. Msk:  Strength and tone normal for age. Extremities/Skin: Warm and dry.  No rashes. Neuro: Alert and oriented X 3. Moves all extremities spontaneously. Gait is normal. CNII-XII grossly in tact. Psych:  Responds to questions appropriately with a normal affect.      ASSESSMENT AND PLAN:  24 y.o. year old female with   1. Viral respiratory infection Rapid strep screen negative. Suspect that this is viral. She can use over-the-counter lozenges or spray or decongestants and cough medications as needed for symptom relief. Follow-up if symptoms worsen significantly or develops significant fever or if symptoms persist greater than 7-10 days. Given note to cover out of work today.  2. Sorethroat - Rapid strep screen (not at [facility])   Signed, [facility] [place], [place], [facility] 03/12/2015 2:46 PM

## 2015-03-14 ENCOUNTER — Encounter: Payer: Self-pay | Admitting: Physician Assistant

## 2015-03-14 ENCOUNTER — Encounter: Payer: Self-pay | Admitting: Family Medicine

## 2015-03-14 ENCOUNTER — Ambulatory Visit (INDEPENDENT_AMBULATORY_CARE_PROVIDER_SITE_OTHER): Payer: BLUE CROSS/BLUE SHIELD | Admitting: Physician Assistant

## 2015-03-14 VITALS — BP 114/80 | HR 100 | Temp 98.3°F | Resp 18 | Wt 299.0 lb

## 2015-03-14 DIAGNOSIS — H6691 Otitis media, unspecified, right ear: Secondary | ICD-10-CM | POA: Diagnosis not present

## 2015-03-14 DIAGNOSIS — J029 Acute pharyngitis, unspecified: Secondary | ICD-10-CM

## 2015-03-14 DIAGNOSIS — J988 Other specified respiratory disorders: Secondary | ICD-10-CM

## 2015-03-14 DIAGNOSIS — B9689 Other specified bacterial agents as the cause of diseases classified elsewhere: Secondary | ICD-10-CM

## 2015-03-14 LAB — RAPID STREP SCREEN (MED CTR MEBANE ONLY): Streptococcus, Group A Screen (Direct): NEGATIVE

## 2015-03-14 MED ORDER — AMOXICILLIN 875 MG PO TABS: 875.0000 mg | ORAL_TABLET | Freq: Two times a day (BID) | ORAL | Status: DC

## 2015-03-14 NOTE — Progress Notes (Signed)
Patient ID: Jenna Bailey MRN: [id number], DOB: [birth year], 24 y.o. Date of Encounter: 03/14/2015, 12:50 PM    Chief Complaint:  Chief Complaint  Patient presents with  . still sick    throat still sore, lose voice, green secretions     HPI: 24 y.o. year old white female presents with above. I reviewed her office visit note with me from 03/12/15. At that time she reported some nasal congestion but stated at that point she was not blowing anything from her nose. At that time had very minimal vague symptoms. Strep test was negative. Was told that she had viral illness that should run its course and resolve on its own. Today she reports that now she is having hoarseness. Also started blowing green mucus from her nose. Feels congestion/pressure in right ear and cannot hear out of right ear like she can out of left ear.  Does not feel that she has congestion in her chest. Minimal sore throat. No fever or chills. 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 Was told that she would have to have a doctor's note for her to do FMLA to be out for 5 consecutive days. Says now they're on a point system and this is the way this works now. Says that she is already scheduled off work today so would need the FMLA dates out of work to start tomorrow. Says that she is also already scheduled to be off work this upcoming Saturday 03/17/15 and then is scheduled to be off again Wednesday 03/21/15. Is requesting a note to cover for dates Thursday 03/15/15 through Tuesday 03/20/15. She is then already scheduled to be off work Wednesday 10/12 and for the note to state she can return to work Thursday 03/22/15.     Home Meds:   Outpatient Prescriptions Prior to Visit  Medication Sig Dispense Refill  . Multiple Vitamin (MULTIVITAMIN) tablet Take 1 tablet by mouth daily.    . Omega-3 Fatty Acids (FISH OIL PO) Take 2 capsules by mouth  daily.    Marland Kitchen omeprazole (PRILOSEC) 40 MG capsule TAKE 1 CAPSULE BY MOUTH EVERY DAY 90 capsule 3  . TRINESSA, 28, 0.18/0.215/0.25 MG-35 MCG tablet TAKE 1 TABLET BY MOUTH DAILY 84 tablet 3   No facility-administered medications prior to visit.    Allergies: No Known Allergies    Review of Systems: See HPI for pertinent ROS. All other ROS negative.    Physical Exam: Blood pressure 114/80, pulse 100, temperature 98.3 F (36.8 C), temperature source Oral, resp. rate 18, weight 299 lb (135.626 kg), last menstrual period 03/11/2015., Body mass index is 48.28 kg/(m^2). General:  Obese WF. Appears in no acute distress. HEENT: Normocephalic, atraumatic, eyes without discharge, sclera non-icteric, nares are without discharge. Bilateral auditory canals clear, TM's are without perforation. Right TM has diffuse red erythema. Left TM is dull.  Oral cavity moist, posterior pharynx without exudate, erythema, peritonsillar abscess.  Neck: Supple. No thyromegaly. No lymphadenopathy. Lungs: Clear bilaterally to auscultation without wheezes, rales, or rhonchi. Breathing is unlabored. Heart: Regular rhythm. No murmurs, rubs, or gallops. Msk:  Strength and tone normal for age. Extremities/Skin: Warm and dry. Neuro: Alert and oriented X 3. Moves all extremities spontaneously. Gait is normal. CNII-XII grossly in tact. Psych:  Responds to questions appropriately with a normal affect.   Results for orders placed or performed in visit on 03/14/15  Rapid  strep screen (not at [facility])  Result Value Ref Range   Source THROAT    Streptococcus, Group A Screen (Direct) NEG NEGATIVE     ASSESSMENT AND PLAN:  24 y.o. year old female with  1. Acute right otitis media, recurrence not specified, unspecified otitis media type - amoxicillin (AMOXIL) 875 MG tablet; Take 1 tablet (875 mg total) by mouth 2 (two) times daily.  Dispense: 14 tablet; Refill: 0  2. Bacterial respiratory infection - amoxicillin (AMOXIL) 875 MG  tablet; Take 1 tablet (875 mg total) by mouth 2 (two) times daily.  Dispense: 14 tablet; Refill: 0  3. Sorethroat - Rapid strep screen (not at [facility])  She is to start amoxicillin immediately, take as directed, complete all of it. Letter given to cover out of work Thursday 03/15/15 - Tuesday 03/20/15. Return to work Thursday 03/22/15. Follow-up if symptoms worsen significantly or do not resolve completion of antibiotic.   Signed, [address] [place], [place], [facility] 03/14/2015 12:50 PM

## 2015-04-04 ENCOUNTER — Telehealth: Payer: Self-pay | Admitting: *Deleted

## 2015-04-04 NOTE — Telephone Encounter (Signed)
Received PA determination.   PA approved 03/14/2015- 10/01/2015.  Ref# [id number]

## 2015-04-04 NOTE — Telephone Encounter (Signed)
Received request from pharmacy for PA on Omeprazole.   PA submitted.   Dx: K21.9- GERD. 

## 2015-07-28 ENCOUNTER — Emergency Department (INDEPENDENT_AMBULATORY_CARE_PROVIDER_SITE_OTHER)
Admission: EM | Admit: 2015-07-28 | Discharge: 2015-07-28 | Disposition: A | Discharge status: Home / Self Care | Payer: BLUE CROSS/BLUE SHIELD | Source: Home / Self Care | Attending: Emergency Medicine | Admitting: Emergency Medicine

## 2015-07-28 ENCOUNTER — Other Ambulatory Visit (HOSPITAL_COMMUNITY)
Admission: RE | Admit: 2015-07-28 | Discharge: 2015-07-28 | Disposition: A | Discharge status: Home / Self Care | Payer: BLUE CROSS/BLUE SHIELD | Source: Ambulatory Visit | Attending: Emergency Medicine | Admitting: Emergency Medicine

## 2015-07-28 ENCOUNTER — Encounter (HOSPITAL_COMMUNITY): Payer: Self-pay | Admitting: *Deleted

## 2015-07-28 DIAGNOSIS — J0100 Acute maxillary sinusitis, unspecified: Secondary | ICD-10-CM

## 2015-07-28 DIAGNOSIS — J029 Acute pharyngitis, unspecified: Secondary | ICD-10-CM

## 2015-07-28 LAB — POCT PREGNANCY, URINE: Preg Test, Ur: NEGATIVE

## 2015-07-28 LAB — POCT INFECTIOUS MONO SCREEN: Mono Screen: NEGATIVE

## 2015-07-28 LAB — POCT RAPID STREP A: Streptococcus, Group A Screen (Direct): NEGATIVE

## 2015-07-28 MED ORDER — AMOXICILLIN-POT CLAVULANATE 875-125 MG PO TABS: 1.0000 | ORAL_TABLET | Freq: Two times a day (BID) | ORAL | Status: DC

## 2015-07-28 MED ORDER — MOMETASONE FUROATE 50 MCG/ACT NA SUSP: 2.0000 | Freq: Every day | NASAL | Status: DC

## 2015-07-28 MED ORDER — IBUPROFEN 800 MG PO TABS: 800.0000 mg | ORAL_TABLET | Freq: Three times a day (TID) | ORAL | Status: DC | PRN

## 2015-07-28 NOTE — ED Provider Notes (Signed)
HPI  SUBJECTIVE:  Jenna Bailey is a 25 y.o. female who presents with nasal congestion, postnasal drip, ST, nonproductive cough, frontal sinus pain/pressure worse with bending forward/lying down x 8 days, bilateral ear pain. She states that her nasal congestion has become greenish. She states that she got better, then got worse. She reports fatigue, and states that her other upper teeth hurt. States that she feels feverish, but has no documented fevers at home. No N/V, other HA, bodyaches, abdominal pain, rash, allergy symptoms. Taking Alka-Seltzer, Mucinex D with some improvement. No aggravating factors. Patient states that she has had several sick contacts or diagnosis for the flu. She did get a flu shot this year. Past medical history negative for mono, diabetes, hypertension. PMD: [facility]. LMP 06/21/2015.   Past Medical History  Diagnosis Date  . GERD (gastroesophageal reflux disease)     Past Surgical History  Procedure Laterality Date  . Wisdom tooth extraction      No family history on file.  Social History  Substance Use Topics  . Smoking status: Never Smoker   . Smokeless tobacco: Never Used  . Alcohol Use: No    No current facility-administered medications for this encounter.  Current outpatient prescriptions:  .  omeprazole (PRILOSEC) 40 MG capsule, TAKE 1 CAPSULE BY MOUTH EVERY DAY, Disp: 90 capsule, Rfl: 3 .  TRINESSA, 28, 0.18/0.215/0.25 MG-35 MCG tablet, TAKE 1 TABLET BY MOUTH DAILY, Disp: 84 tablet, Rfl: 3 .  amoxicillin-clavulanate (AUGMENTIN) 875-125 MG tablet, Take 1 tablet by mouth 2 (two) times daily. X 7 days, Disp: 14 tablet, Rfl: 0 .  ibuprofen (ADVIL,MOTRIN) 800 MG tablet, Take 1 tablet (800 mg total) by mouth every 8 (eight) hours as needed., Disp: 30 tablet, Rfl: 0 .  mometasone (NASONEX) 50 MCG/ACT nasal spray, Place 2 sprays into the nose daily., Disp: 17 g, Rfl: 0 .  Multiple Vitamin (MULTIVITAMIN) tablet, Take 1 tablet by mouth  daily., Disp: , Rfl:  .  Omega-3 Fatty Acids (FISH OIL PO), Take 2 capsules by mouth daily., Disp: , Rfl:   No Known Allergies   ROS  As noted in HPI.   Physical Exam  BP 136/91 mmHg  Pulse 116  Temp(Src) 99.5 F (37.5 C) (Oral)  SpO2 97%  LMP 06/21/2015 (Exact Date)  Constitutional: Well developed, well nourished, no acute distress. Obese. Eyes:  EOMI, conjunctiva normal bilaterally HENT: Normocephalic, atraumatic,mucus membranes moist. TMs normal bilaterally. + purulent nasal congestion. Swollen red turbinates. + maxillary sinus tenderness, - frontal sinus tenderness. Lymph: No cervical lymphadenopathy. Oropharynx  Erythematous, swollen tonsils with exudates bilaterally Respiratory: Normal inspiratory effort lungs clear bilaterally Cardiovascular: Mild tachycardia no murmurs, rubs, gallops GI: nondistended, no palpable spleen, no left upper quadrant tenderness. skin: No rash, skin intact Musculoskeletal: no deformities Neurologic: Alert & oriented x 3, no focal neuro deficits Psychiatric: Speech and behavior appropriate   ED Course   Medications - No data to display  Orders Placed This Encounter  Procedures  . POCT rapid strep A [facility])    Standing Status: Standing     Number of Occurrences: 1     Standing Expiration Date:   . Pregnancy, urine POC    Standing Status: Standing     Number of Occurrences: 1     Standing Expiration Date:   . Infectious mono screen, POC    Standing Status: Standing     Number of Occurrences: 1     Standing Expiration Date:  Results for orders placed or performed during the hospital encounter of 07/28/15 (from the past 24 hour(s))  POCT rapid strep A [facility])     Status: None   Collection Time: 07/28/15  4:46 PM  Result Value Ref Range   Streptococcus, Group A Screen (Direct) NEGATIVE NEGATIVE  Pregnancy, urine POC     Status: None   Collection Time: 07/28/15  4:46 PM  Result Value Ref Range   Preg Test, Ur  NEGATIVE NEGATIVE  Infectious mono screen, POC     Status: None   Collection Time: 07/28/15  4:46 PM  Result Value Ref Range   Mono Screen NEGATIVE NEGATIVE   No results found.  ED Clinical Impression  Acute maxillary sinusitis, recurrence not specified  Exudative pharyngitis   ED Assessment/Plan We'll rule out strep and mono given exudative pharyngitis, no evidence of otitis.   Strep, mono negative. Throat culture sent. Urine pregnancy negative.  We will Treat sinusitis -Pt with indications for abx due to double sickening.. Will start augmentin in addition to nasal steriods, saline nasal irrigation, increase fluids, tylenol/motrin prn pain. Continue Mucinex D. Supportive care for the sore throat. Discussed possibility of this being mono. Discussed MDM and plan with pt. Pt agrees with plan and will f/u with PMD prn.  *This clinic note was created using Dragon dictation software. Therefore, there may be occasional mistakes despite careful proofreading.  ?    Domenick Gong, MD 07/28/15 [phone]

## 2015-07-28 NOTE — ED Notes (Signed)
C/O bilat earache, green nasal discharge, HA, feeling feverish x 8 days.  Has been taking Alka Seltzer and Mucinex.  C/O cough - occasionally productive.

## 2015-07-28 NOTE — Discharge Instructions (Signed)
Take the medication as written. Return if you get worse, have a fever >100.4, or for any concerns. You may take 800 mg of motrin with 1 gram of tylenol up to 3 times a day as needed for pain. This is an effective combination for pain. Use a neti pot or the NeilMed sinus rinse as often as you want to to reduce nasal congestion. Follow the directions on the box.  ° °Go to www.goodrx.com to look up your medications. This will give you a list of where you can find your prescriptions at the most affordable prices.  ° °

## 2015-07-31 LAB — CULTURE, GROUP A STREP (THRC)

## 2015-08-03 ENCOUNTER — Other Ambulatory Visit: Payer: Self-pay | Admitting: Family Medicine

## 2015-08-03 NOTE — Telephone Encounter (Signed)
Medication refilled per protocol. 

## 2015-09-05 ENCOUNTER — Encounter: Payer: Self-pay | Admitting: Family Medicine

## 2015-09-05 ENCOUNTER — Ambulatory Visit (INDEPENDENT_AMBULATORY_CARE_PROVIDER_SITE_OTHER): Payer: BLUE CROSS/BLUE SHIELD | Admitting: Family Medicine

## 2015-09-05 VITALS — BP 128/78 | HR 88 | Temp 97.7°F | Resp 16 | Ht 66.0 in | Wt 303.0 lb

## 2015-09-05 DIAGNOSIS — N912 Amenorrhea, unspecified: Secondary | ICD-10-CM

## 2015-09-05 DIAGNOSIS — E781 Pure hyperglyceridemia: Secondary | ICD-10-CM

## 2015-09-05 DIAGNOSIS — K219 Gastro-esophageal reflux disease without esophagitis: Secondary | ICD-10-CM | POA: Diagnosis not present

## 2015-09-05 DIAGNOSIS — Z0000 Encounter for general adult medical examination without abnormal findings: Secondary | ICD-10-CM | POA: Diagnosis not present

## 2015-09-05 DIAGNOSIS — L0293 Carbuncle, unspecified: Secondary | ICD-10-CM

## 2015-09-05 LAB — TSH: TSH: 3.29 mIU/L

## 2015-09-05 LAB — COMPREHENSIVE METABOLIC PANEL
ALK PHOS: 72 U/L (ref 33–115)
ALT: 48 U/L — AB (ref 6–29)
AST: 34 U/L — AB (ref 10–30)
Albumin: 4.1 g/dL (ref 3.6–5.1)
BUN: 7 mg/dL (ref 7–25)
CO2: 27 mmol/L (ref 20–31)
Calcium: 9.5 mg/dL (ref 8.6–10.2)
Chloride: 100 mmol/L (ref 98–110)
Creat: 0.47 mg/dL — ABNORMAL LOW (ref 0.50–1.10)
GLUCOSE: 79 mg/dL (ref 70–99)
POTASSIUM: 4.2 mmol/L (ref 3.5–5.3)
Sodium: 139 mmol/L (ref 135–146)
Total Bilirubin: 0.6 mg/dL (ref 0.2–1.2)
Total Protein: 6.8 g/dL (ref 6.1–8.1)

## 2015-09-05 LAB — CBC WITH DIFFERENTIAL/PLATELET
BASOS PCT: 0 % (ref 0–1)
Basophils Absolute: 0 10*3/uL (ref 0.0–0.1)
Eosinophils Absolute: 0.1 10*3/uL (ref 0.0–0.7)
Eosinophils Relative: 1 % (ref 0–5)
HEMATOCRIT: 36.6 % (ref 36.0–46.0)
HEMOGLOBIN: 11.7 g/dL — AB (ref 12.0–15.0)
LYMPHS ABS: 3.7 10*3/uL (ref 0.7–4.0)
Lymphocytes Relative: 39 % (ref 12–46)
MCH: 25.3 pg — AB (ref 26.0–34.0)
MCHC: 32 g/dL (ref 30.0–36.0)
MCV: 79 fL (ref 78.0–100.0)
MONO ABS: 0.6 10*3/uL (ref 0.1–1.0)
MONOS PCT: 6 % (ref 3–12)
MPV: 8.9 fL (ref 8.6–12.4)
NEUTROS ABS: 5.2 10*3/uL (ref 1.7–7.7)
Neutrophils Relative %: 54 % (ref 43–77)
Platelets: 383 10*3/uL (ref 150–400)
RBC: 4.63 MIL/uL (ref 3.87–5.11)
RDW: 15.6 % — ABNORMAL HIGH (ref 11.5–15.5)
WBC: 9.6 10*3/uL (ref 4.0–10.5)

## 2015-09-05 LAB — LIPID PANEL
CHOL/HDL RATIO: 5.7 ratio — AB (ref <=5.0)
CHOLESTEROL: 171 mg/dL (ref 125–200)
HDL: 30 mg/dL — ABNORMAL LOW (ref >=46)
LDL CALC: 104 mg/dL (ref <130)
Triglycerides: 186 mg/dL — ABNORMAL HIGH (ref <150)
VLDL: 37 mg/dL — AB (ref <30)

## 2015-09-05 MED ORDER — PHENTERMINE HCL 37.5 MG PO TABS: 37.5000 mg | ORAL_TABLET | Freq: Every day | ORAL | Status: DC

## 2015-09-05 NOTE — Patient Instructions (Addendum)
I recommend eye visit once a year I recommend dental visit every 6 months Goal is to  Exercise 30 minutes 5 days a week We will send a letter with lab results  Take 1/2 tablet of phentermine for 2 weeks in the morning then increase to `1 tablet  F/U 2 months for weight

## 2015-09-05 NOTE — Progress Notes (Signed)
Patient ID: Jenna MaidensKatie K Maden, female   DOB: 07/21/1990, 25 y.o.   MRN: [id number]    Subjective:    Patient ID: Jenna Bailey, female    DOB: 07/21/1990, 25 y.o.   MRN: [id number]  Patient presents for CPE   Patient here for complete physical exam. Unfortunately she has not had any significant weight loss she's still at 300 pounds. She admits to snacking on chips and junk food, but does not eat fast food very much, tries to grill and back. She is not exercising   We have been monitoring her cholesterol and triglycerides have been elevated she has been on fish oil  twice a day she's also had some mild anemia and has been taking multivitamin with iron  He has not had a period since January she is taking multiple pregnancy tests which of the negative. He does feel some cramping and feels like it is coming on just has not started.  She also continues to get boils which she self treats at home she does have 1 on her left inner thigh that has not gone down. She is using antibacterial soaps. Review Of Systems:  GEN- denies fatigue, fever, weight loss,weakness, recent illness HEENT- denies eye drainage, change in vision, nasal discharge, CVS- denies chest pain, palpitations RESP- denies SOB, cough, wheeze ABD- denies N/V, change in stools, abd pain GU- denies dysuria, hematuria, dribbling, incontinence MSK- denies joint pain, muscle aches, injury Neuro- denies headache, dizziness, syncope, seizure activity       Objective:    BP 128/78 mmHg  Pulse 88  Temp(Src) 97.7 F (36.5 C) (Oral)  Resp 16  Ht 5\' 6"  (1.676 m)  Wt 303 lb (137.44 kg)  BMI 48.93 kg/m2  LMP 07/07/2015 GEN- NAD, alert and oriented x3,obese HEENT- PERRL, EOMI, non injected sclera, pink conjunctiva, MMM, oropharynx clear Neck- Supple, no thyromegaly CVS- RRR, no murmur RESP-CTAB ABD-NABS,soft,NT,ND Skin- Left inner thigh- indurated boil, non fluatant, NT, no erythema  EXT- No edema Pulses- Radial, DP- 2+         Assessment & Plan:      Problem List Items Addressed This Visit    Recurrent boils    Current boil, unable to I and D, will give bactrim      Morbid obesity (HCC) - Primary    She has tried "dieting" on her on with little success Will try phentermine. Discussed the side effects of the medication. She will start with one half tablet and titrated up to a full tablet. Will recheck her weight in 2 months. Our goal is at least 10 pounds off by that visit      Relevant Medications   phentermine (ADIPEX-P) 37.5 MG tablet   Other Relevant Orders   Hemoglobin A1c   Hypertriglyceridemia    Recheck labs today      Relevant Orders   Lipid panel   GERD (gastroesophageal reflux disease)    Continue prilosec        Other Visit Diagnoses    Routine general medical examination at a health care facility        CPE done, PAP Smear UTD, fastingl labs    Relevant Orders    Comprehensive metabolic panel    CBC with Differential/Platelet    TSH    Amenorrhea        Upreg negative, She may be having hormonal changes in setting of her obesity, check labs    Relevant Orders    Pregnancy, urine  Note: This dictation was prepared with Dragon dictation along with smaller phrase technology. Any transcriptional errors that result from this process are unintentional.

## 2015-09-05 NOTE — Assessment & Plan Note (Signed)
Current boil, unable to I and D, will give bactrim

## 2015-09-05 NOTE — Assessment & Plan Note (Addendum)
She has tried "dieting" on her on with little success Will try phentermine. Discussed the side effects of the medication. She will start with one half tablet and titrated up to a full tablet. Will recheck her weight in 2 months. Our goal is at least 10 pounds off by that visit

## 2015-09-05 NOTE — Assessment & Plan Note (Signed)
Continue prilosec

## 2015-09-05 NOTE — Assessment & Plan Note (Signed)
Recheck labs today. 

## 2015-09-06 LAB — HEMOGLOBIN A1C
HEMOGLOBIN A1C: 5.8 % — AB (ref <5.7)
Mean Plasma Glucose: 120 mg/dL

## 2015-09-06 LAB — PREGNANCY, URINE: PREG TEST UR: NEGATIVE

## 2015-09-17 ENCOUNTER — Telehealth: Payer: Self-pay | Admitting: *Deleted

## 2015-09-17 NOTE — Telephone Encounter (Signed)
Received PA determination.   PA approved 08/27/2015- 03/15/2016.  Case [id number]  Pharmacy made aware.

## 2015-09-17 NOTE — Telephone Encounter (Signed)
Received request from pharmacy for PA on Omeprazole.   PA submitted.   Dx: K21.9- GERD. 

## 2015-10-01 ENCOUNTER — Encounter: Payer: Self-pay | Admitting: Family Medicine

## 2015-10-01 ENCOUNTER — Other Ambulatory Visit: Payer: Self-pay | Admitting: Family Medicine

## 2015-10-01 ENCOUNTER — Ambulatory Visit (INDEPENDENT_AMBULATORY_CARE_PROVIDER_SITE_OTHER): Payer: BLUE CROSS/BLUE SHIELD | Admitting: Family Medicine

## 2015-10-01 VITALS — BP 126/82 | HR 78 | Temp 97.8°F | Resp 14 | Ht 66.0 in | Wt 291.0 lb

## 2015-10-01 DIAGNOSIS — L03311 Cellulitis of abdominal wall: Secondary | ICD-10-CM

## 2015-10-01 DIAGNOSIS — K651 Peritoneal abscess: Secondary | ICD-10-CM | POA: Diagnosis not present

## 2015-10-01 DIAGNOSIS — IMO0002 Reserved for concepts with insufficient information to code with codable children: Secondary | ICD-10-CM

## 2015-10-01 LAB — CBC
HCT: 35.6 % (ref 35.0–45.0)
HEMOGLOBIN: 11.3 g/dL — AB (ref 12.0–15.0)
MCH: 25.6 pg — AB (ref 27.0–33.0)
MCHC: 31.7 g/dL — ABNORMAL LOW (ref 32.0–36.0)
MCV: 80.5 fL (ref 80.0–100.0)
MPV: 10.2 fL (ref 7.5–12.5)
PLATELETS: 376 10*3/uL (ref 140–400)
RBC: 4.42 MIL/uL (ref 3.80–5.10)
RDW: 15.5 % — ABNORMAL HIGH (ref 11.0–15.0)
WBC: 23.8 10*3/uL — ABNORMAL HIGH (ref 3.8–10.8)

## 2015-10-01 MED ORDER — SULFAMETHOXAZOLE-TRIMETHOPRIM 800-160 MG PO TABS: 1.0000 | ORAL_TABLET | Freq: Two times a day (BID) | ORAL | Status: DC

## 2015-10-01 MED ORDER — HYDROCODONE-ACETAMINOPHEN 5-325 MG PO TABS: 1.0000 | ORAL_TABLET | Freq: Four times a day (QID) | ORAL | Status: DC | PRN

## 2015-10-01 NOTE — Patient Instructions (Signed)
Take antibiotics as prescribed Use pain medicine as needed  Warm compresses or warm showers F/U Wed for recheck

## 2015-10-01 NOTE — Progress Notes (Signed)
Patient ID: Jenna Bailey, female   DOB: [birth year], 25 y.o.   MRN: [id number]    Subjective:    Patient ID: Jenna Bailey, female    DOB: [birth year], 25 y.o.   MRN: [id number]  Patient presents for Abdominal Abscess Patient with boil to her abdomen. On April 16 she noticed a pimple-like lesion she initially thought it was actually bug bite. She tires to squeeze it. Over the next couple days it became hard and red. Over the weekend she began having pain in her abdomen and noticed redness starting to spread up towards her right upper quadrant. Yesterday the initial pimple-like lesion started to drain. She has subjective fever and some chills last night. She denies any nausea vomiting. She is taking her medications otherwise as described she is taking the phentermine her weight is down 12 pounds. She is history of recurrent boils.  Note after her last visit she never received the Bactrim for the other boil and it went away on its own.    Review Of Systems:  GEN- denies fatigue, fever, weight loss,weakness, recent illness HEENT- denies eye drainage, change in vision, nasal discharge, CVS- denies chest pain, palpitations RESP- denies SOB, cough, wheeze ABD- denies N/V, change in stools, abd pain GU- denies dysuria, hematuria, dribbling, incontinence MSK- denies joint pain, muscle aches, injury Neuro- denies headache, dizziness, syncope, seizure activity       Objective:    BP 126/82 mmHg  Pulse 78  Temp(Src) 97.8 F (36.6 C) (Oral)  Resp 14  Ht 5\' 6"  (1.676 m)  Wt 291 lb (131.997 kg)  BMI 46.99 kg/m2  LMP 07/03/2015 GEN- NAD, alert and oriented x3 Skin- Center lower abdomen below umbilicus- small abscess- leaking pus  with surronding erythema and induration at least 4 ichnes surrounding, +warm, extends toward RUQ with another area of induration and small scab at center no fluctuance   Procedure- Incision and Drainage Procedure explained to patient questions answered benefits and  risks discussed verbal  consent obtained. Antiseptic-Betadine Anesthesia-lidocaine 1% with Epi  Incision performed moderate amount of pus expressed Culture taken Minimal blood loss 6 inches of iodoform packed  Patient tolerated procedure well Bandage applied         Assessment & Plan:      Problem List Items Addressed This Visit    None    Visit Diagnoses    Cellulitis, abdominal wall    -  Primary    abscess with cellulitis for past 8 days. WBC elevated at 23,000, no fever, non toxic appearing in office, start Bactrim ,pain meds, F/U 48 hours for recheck. Given note for work. With an open wound in the pain and do not recommend that she be an office setting.     Relevant Orders    Wound culture    CBC w/[facility] & 3 Part Diff    Abscess, abdomen (HCC)        Relevant Orders    Wound culture    CBC w/[facility] & 3 Part Diff       Note: This dictation was prepared with Dragon dictation along with smaller phrase technology. Any transcriptional errors that result from this process are unintentional.

## 2015-10-03 ENCOUNTER — Encounter: Payer: Self-pay | Admitting: Family Medicine

## 2015-10-03 ENCOUNTER — Ambulatory Visit (INDEPENDENT_AMBULATORY_CARE_PROVIDER_SITE_OTHER): Payer: BLUE CROSS/BLUE SHIELD | Admitting: Family Medicine

## 2015-10-03 VITALS — BP 122/64 | HR 82 | Temp 98.4°F | Resp 14 | Ht 66.0 in | Wt 289.0 lb

## 2015-10-03 DIAGNOSIS — L03311 Cellulitis of abdominal wall: Secondary | ICD-10-CM

## 2015-10-03 DIAGNOSIS — L02211 Cutaneous abscess of abdominal wall: Secondary | ICD-10-CM

## 2015-10-03 LAB — CBC
HEMATOCRIT: 35 % (ref 35.0–45.0)
HEMOGLOBIN: 11.3 g/dL — AB (ref 12.0–15.0)
MCH: 25.9 pg — AB (ref 27.0–33.0)
MCHC: 32.3 g/dL (ref 32.0–36.0)
MCV: 80.3 fL (ref 80.0–100.0)
MPV: 10 fL (ref 7.5–12.5)
Platelets: 462 10*3/uL — ABNORMAL HIGH (ref 140–400)
RBC: 4.36 MIL/uL (ref 3.80–5.10)
RDW: 15.7 % — ABNORMAL HIGH (ref 11.0–15.0)
WBC: 13.6 10*3/uL — ABNORMAL HIGH (ref 3.8–10.8)

## 2015-10-03 MED ORDER — SULFAMETHOXAZOLE-TRIMETHOPRIM 800-160 MG PO TABS: 2.0000 | ORAL_TABLET | Freq: Two times a day (BID) | ORAL | Status: DC

## 2015-10-03 NOTE — Patient Instructions (Addendum)
Continue the antibiotics, instead take  2 tablets twice a day  F/U Friday at 8:15am

## 2015-10-03 NOTE — Progress Notes (Signed)
Patient ID: Jenna Bailey, female   DOB: [birth year], 25 y.o.   MRN: [id number]   Subjective:    Patient ID: Jenna MaidensKatie K Mccalla, female    DOB: [birth year], 25 y.o.   MRN: [id number]  Patient presents for F/U ABD Abscess  Patient here to follow-up abdominal wall abscess and cellulitis. She was seen 48 hours ago. Her white blood cell count was elevated to 23,000. She had some subjective fever and chills. She was started on Bactrim double strength 1 tablet twice a day. I and D was performed on April 24 about 6 inches of packing was placed her culture as far shows staph aureus sensitivities are not yet. No fever no chills, still with pain   WBC- 13.6  Review Of Systems:  GEN- denies fatigue, fever, weight loss,weakness, recent illness HEENT- denies eye drainage, change in vision, nasal discharge, CVS- denies chest pain, palpitations RESP- denies SOB, cough, wheeze ABD- denies N/V, change in stools, abd pain Neuro- denies headache, dizziness, syncope, seizure activity       Objective:    BP 122/64 mmHg  Pulse 82  Temp(Src) 98.4 F (36.9 C) (Oral)  Resp 14  Ht 5\' 6"  (1.676 m)  Wt 289 lb (131.09 kg)  BMI 46.67 kg/m2  LMP 07/03/2015 GEN- NAD, alert and oriented x3 Skin- Center lower abdomen below umbilicus-  abscess- draining  pus  with surronding erythema and induration at least 4 ichnes surrounding, +warm, extends toward RUQ with induration Packing removed with bandage removal - No significant change from Monday          Assessment & Plan:      Problem List Items Addressed This Visit    None    Visit Diagnoses    Cellulitis, abdominal wall    -  Primary    Relevant Orders    CBC (Completed)    Abscess of abdominal wall         Minimal improvement today, significant drainage from abscess, still large area of induration extending toward RUQ and cellulitis. No systemic symptoms, WBC improved Changed to bactrim DS 2 tab BID Add- after visit culture returned with MRSA  WIll also  add bactroban to nares as she is colonized with these recurrent boils Will recheck in another 48hours hopefully we will get some improvement if not she will need admission     Relevant Orders    CBC (Completed)       Note: This dictation was prepared with Dragon dictation along with smaller phrase technology. Any transcriptional errors that result from this process are unintentional.

## 2015-10-04 LAB — WOUND CULTURE: Gram Stain: NONE SEEN

## 2015-10-04 MED ORDER — MUPIROCIN CALCIUM 2 % NA OINT: 1.0000 "application " | TOPICAL_OINTMENT | Freq: Two times a day (BID) | NASAL | Status: DC

## 2015-10-05 ENCOUNTER — Ambulatory Visit (INDEPENDENT_AMBULATORY_CARE_PROVIDER_SITE_OTHER): Payer: BLUE CROSS/BLUE SHIELD | Admitting: Family Medicine

## 2015-10-05 ENCOUNTER — Encounter: Payer: Self-pay | Admitting: Family Medicine

## 2015-10-05 VITALS — BP 118/64 | HR 72 | Temp 98.7°F | Resp 16 | Ht 67.0 in | Wt 287.0 lb

## 2015-10-05 DIAGNOSIS — L03311 Cellulitis of abdominal wall: Secondary | ICD-10-CM

## 2015-10-05 DIAGNOSIS — Z22322 Carrier or suspected carrier of Methicillin resistant Staphylococcus aureus: Secondary | ICD-10-CM

## 2015-10-05 DIAGNOSIS — L02211 Cutaneous abscess of abdominal wall: Secondary | ICD-10-CM

## 2015-10-05 MED ORDER — MUPIROCIN CALCIUM 2 % NA OINT: 1.0000 "application " | TOPICAL_OINTMENT | Freq: Two times a day (BID) | NASAL | Status: DC

## 2015-10-05 NOTE — Progress Notes (Signed)
Patient ID: Jenna Bailey, female   DOB: [birth year], 25 y.o.   MRN: [id number]   Subjective:    Patient ID: Jenna Bailey, female    DOB: [birth year], 25 y.o.   MRN: [id number]  Patient presents for F/U Cellulitis  Patient here for interim follow-up on abdominal wall abscess and cellulitis. She's now on Bactrim 2 tablets twice a day double strength. Her culture did return with MRSA. She's not had any fever or chills the drainage has improved. The  erythema and induration is also gone down some. She states that she feels much better.   Review Of Systems:  GEN- denies fatigue, fever, weight loss,weakness, recent illness HEENT- denies eye drainage, change in vision, nasal discharge, CVS- denies chest pain, palpitations RESP- denies SOB, cough, wheeze ABD- denies N/V, change in stools, abd painry Neuro- denies headache, dizziness, syncope, seizure activity       Objective:    BP 118/64 mmHg  Pulse 72  Temp(Src) 98.7 F (37.1 C) (Oral)  Resp 16  Ht 5\' 7"  (1.702 m)  Wt 287 lb (130.182 kg)  BMI 44.94 kg/m2  LMP 07/03/2015 GEN- NAD, alert and oriented x3 Skin- Mid abdominal wall- abscess mild drainage, erythema and induration surrounding but improved, extends to RUQ- very mild erythema no induration NT in this region, mild peeling of skin         Assessment & Plan:      Problem List Items Addressed This Visit    None    Visit Diagnoses    Abdominal wall abscess    -  Primary    MRSA, abcess/cellulitis, much improved today, continue 10 day course of bactrim DS 2 tabs BID, given batroban to nares, F/U monday     Cellulitis, abdominal wall        MRSA (methicillin resistant staph aureus) culture positive           Note: This dictation was prepared with Dragon dictation along with smaller phrase technology. Any transcriptional errors that result from this process are unintentional.

## 2015-10-05 NOTE — Patient Instructions (Signed)
F/U Monday for recheck  

## 2015-10-08 ENCOUNTER — Encounter: Payer: Self-pay | Admitting: Family Medicine

## 2015-10-08 ENCOUNTER — Ambulatory Visit (INDEPENDENT_AMBULATORY_CARE_PROVIDER_SITE_OTHER): Payer: BLUE CROSS/BLUE SHIELD | Admitting: Family Medicine

## 2015-10-08 VITALS — BP 124/68 | HR 72 | Temp 98.0°F | Resp 16 | Ht 67.0 in | Wt 287.0 lb

## 2015-10-08 DIAGNOSIS — L03311 Cellulitis of abdominal wall: Secondary | ICD-10-CM

## 2015-10-08 DIAGNOSIS — L02211 Cutaneous abscess of abdominal wall: Secondary | ICD-10-CM

## 2015-10-08 NOTE — Progress Notes (Signed)
Patient ID: Jenna Bailey, female   DOB: 07-05-1990, 25 y.o.   MRN: [id number]   Subjective:    Patient ID: Jenna MaidensKatie K Bailey, female    DOB: 07-05-1990, 25 y.o.   MRN: [id number]  Patient presents for F/U Abscess/ Cellulitis  History here for recheck on abdominal wall cellulitis and abscess MRSA bacteria. She has not had the Bactroban to her nares as the pharmacy was on back order. She will start this today. She is still taking her antibiosis prescribed. She is very little discomfort now she still gets some drainage from her wound that is starting to close up. The redness has gone down significantly.   Review Of Systems:  GEN- denies fatigue, fever, weight loss,weakness, recent illness HEENT- denies eye drainage, change in vision, nasal discharge, CVS- denies chest pain, palpitations RESP- denies SOB, cough, wheeze ABD- denies N/V, change in stools, abd pain GU- denies dysuria, hematuria, dribbling, incontinence MSK- denies joint pain, muscle aches, injury Neuro- denies headache, dizziness, syncope, seizure activity       Objective:    BP 124/68 mmHg  Pulse 72  Temp(Src) 98 F (36.7 C) (Oral)  Resp 16  Ht 5\' 7"  (1.702 m)  Wt 287 lb (130.182 kg)  BMI 44.94 kg/m2  LMP 10/03/2015 GEN- NAD, alert and oriented x3 Skin- minimal erythema around incision site only, mild induration 1inch to right of incision site, NT. Clear drainage       Assessment & Plan:      Problem List Items Addressed This Visit    None    Visit Diagnoses    Abscess of abdominal wall    -  Primary    Her abscess is much improved. She will complete the 10 day course of the double strength Bactrim. She will also be the Bactroban. She is okay to return to work     Cellulitis, abdominal wall           Note: This dictation was prepared with Nurse, children'sDragon dictation along with smaller Lobbyistphrase technology. Any transcriptional errors that result from this process are unintentional.

## 2015-10-08 NOTE — Patient Instructions (Signed)
Bactroban to nares Complete antibiotics  F/U change F/U to end of June

## 2015-10-18 ENCOUNTER — Telehealth: Payer: Self-pay | Admitting: Family Medicine

## 2015-10-18 NOTE — Telephone Encounter (Signed)
Pt would like a call back from a nurse regarding her FMLA paperwork. [phone]

## 2015-10-18 NOTE — Telephone Encounter (Signed)
Call placed to patient. LMTRC.  

## 2015-10-19 NOTE — Telephone Encounter (Signed)
Call placed to patient. LMTRC.  

## 2015-10-22 NOTE — Telephone Encounter (Signed)
Multiple calls placed to patient with no answer and no return call.   Message to be closed.  

## 2015-10-26 ENCOUNTER — Ambulatory Visit (INDEPENDENT_AMBULATORY_CARE_PROVIDER_SITE_OTHER): Payer: BLUE CROSS/BLUE SHIELD | Admitting: *Deleted

## 2015-10-26 DIAGNOSIS — Z111 Encounter for screening for respiratory tuberculosis: Secondary | ICD-10-CM

## 2015-10-26 NOTE — Patient Instructions (Signed)
Return on Monday to have PPD read.

## 2015-10-26 NOTE — Progress Notes (Signed)
Patient ID: Jenna Bailey, female   DOB: [birth year], 25 y.o.   MRN: [id number]  Patient seen in office to have PPD placed.   Tolerated ID administration well.   Advised to return on Monday to have PPD read. Verbalized understanding.

## 2015-10-29 ENCOUNTER — Ambulatory Visit: Payer: BLUE CROSS/BLUE SHIELD | Admitting: *Deleted

## 2015-10-29 ENCOUNTER — Other Ambulatory Visit: Payer: Self-pay | Admitting: Family Medicine

## 2015-10-29 DIAGNOSIS — Z111 Encounter for screening for respiratory tuberculosis: Secondary | ICD-10-CM

## 2015-10-29 LAB — TB SKIN TEST
INDURATION: 0 mm
TB Skin Test: NEGATIVE

## 2015-10-29 NOTE — Telephone Encounter (Signed)
Refill appropriate and filled per protocol. 

## 2015-10-29 NOTE — Progress Notes (Signed)
Patient ID: Jenna Bailey, female   DOB: August 31, 1990, 25 y.o.   MRN: [id number]  Patient seen in office to have PPD read.   No redness or induration noted.   Read as negative.

## 2015-11-07 ENCOUNTER — Ambulatory Visit: Payer: BLUE CROSS/BLUE SHIELD | Admitting: Family Medicine

## 2015-12-05 ENCOUNTER — Ambulatory Visit: Payer: BLUE CROSS/BLUE SHIELD | Admitting: Family Medicine

## 2016-06-16 ENCOUNTER — Encounter: Payer: BLUE CROSS/BLUE SHIELD | Admitting: Obstetrics & Gynecology

## 2016-08-24 ENCOUNTER — Other Ambulatory Visit: Payer: Self-pay | Admitting: Family Medicine

## 2016-08-25 ENCOUNTER — Encounter: Payer: Self-pay | Admitting: Family Medicine

## 2016-08-25 ENCOUNTER — Ambulatory Visit (INDEPENDENT_AMBULATORY_CARE_PROVIDER_SITE_OTHER): Payer: BLUE CROSS/BLUE SHIELD | Admitting: Family Medicine

## 2016-08-25 VITALS — BP 129/82 | HR 111 | Resp 20 | Ht 66.0 in | Wt 297.0 lb

## 2016-08-25 DIAGNOSIS — Z3169 Encounter for other general counseling and advice on procreation: Secondary | ICD-10-CM

## 2016-08-25 DIAGNOSIS — N939 Abnormal uterine and vaginal bleeding, unspecified: Secondary | ICD-10-CM

## 2016-08-25 HISTORY — DX: Abnormal uterine and vaginal bleeding, unspecified: N93.9

## 2016-08-25 MED ORDER — MEDROXYPROGESTERONE ACETATE 10 MG PO TABS: 10.0000 mg | ORAL_TABLET | Freq: Every day | ORAL | 3 refills | Status: DC

## 2016-08-25 MED ORDER — LETROZOLE 2.5 MG PO TABS
2.5000 mg | ORAL_TABLET | Freq: Every day | ORAL | 5 refills | Status: DC
Start: 2016-08-25 — End: 2017-06-15

## 2016-08-25 MED ORDER — MEGESTROL ACETATE 40 MG PO TABS: 40.0000 mg | ORAL_TABLET | Freq: Two times a day (BID) | ORAL | 3 refills | Status: DC

## 2016-08-25 NOTE — Assessment & Plan Note (Signed)
Suspect anovulatory, given hx. Trial of Provera + Femara day 3-7. If no luck in 3-6 months, continue with full fertility w/u.

## 2016-08-25 NOTE — Assessment & Plan Note (Signed)
?   Anovulatory--perhaps PCOS. Lengthy discussion had with pt. to explain PCOS, diagrams and verbal communication used to show impact on ovaries, endometrium, need for endometrial protection, impact on fertility, medical treatments to achieve necessary endpoints.  Should be on cyclical progestin vs. OC's if not trying to get pregnant.  Should be on Femara or clomid if trying to conceive. Will give Megace to stop bleeding. Request records from [facility]

## 2016-08-25 NOTE — Progress Notes (Signed)
Pt is here today for irregular menstrual cycles.  States she went off of OCPs in January 2017 to try to conceive and has been bleeding on and off since then.

## 2016-08-25 NOTE — Progress Notes (Signed)
    Subjective:    Patient ID: Halina MaidensKatie K Ohlson is a 26 y.o. female presenting with Metrorrhagia  on 08/25/2016  HPI: Menarche at age 26. Cycles were regular. Cycles then became irregular in 2010. Got on the pill at that time. Now hoping to conceive. Off since 06/2015. Reports bleeding every single day since coming off the pill. Stopped for a week in September but then started back. Reports work-up with [facility] and they did TSH, pelvic sono and iron check. States all normal except iron was low.last pap in 2016.  Review of Systems  Constitutional: Negative for chills and fever.  Respiratory: Negative for shortness of breath.   Cardiovascular: Negative for chest pain.  Gastrointestinal: Negative for abdominal pain, nausea and vomiting.  Genitourinary: Negative for dysuria.  Skin: Negative for rash.      Objective:    BP 129/82 (BP Location: Left Arm, Patient Position: Sitting, Cuff Size: Large)   Pulse (!) 111   Resp 20   Ht 5\' 6"  (1.676 m)   Wt 297 lb (134.7 kg)   BMI 47.94 kg/m  Physical Exam  Constitutional: She is oriented to person, place, and time. She appears well-developed and well-nourished. No distress.  HENT:  Head: Normocephalic and atraumatic.  Eyes: No scleral icterus.  Neck: Neck supple.  Cardiovascular: Normal rate.   Pulmonary/Chest: Effort normal.  Abdominal: Soft.  Neurological: She is alert and oriented to person, place, and time.  Skin: Skin is warm and dry.  Psychiatric: She has a normal mood and affect.        Assessment & Plan:   Problem List Items Addressed This Visit      Unprioritized   Infertility counseling    Suspect anovulatory, given hx. Trial of Provera + Femara day 3-7. If no luck in 3-6 months, continue with full fertility w/u.      Relevant Medications   letrozole (FEMARA) 2.5 MG tablet   Abnormal uterine bleeding - Primary    ? Anovulatory--perhaps PCOS. Lengthy discussion had with pt. to explain PCOS, diagrams and  verbal communication used to show impact on ovaries, endometrium, need for endometrial protection, impact on fertility, medical treatments to achieve necessary endpoints.  Should be on cyclical progestin vs. OC's if not trying to get pregnant.  Should be on Femara or clomid if trying to conceive. Will give Megace to stop bleeding. Request records from [facility]      Relevant Medications   ferrous sulfate 325 (65 FE) MG tablet   megestrol (MEGACE) 40 MG tablet   medroxyPROGESTERone (PROVERA) 10 MG tablet      Total face-to-face time with patient: 30 minutes. Over 50% of encounter was spent on counseling and coordination of care. Return in about 3 months (around 11/25/2016).  Reva Boresanya S Pratt 08/25/2016 2:03 PM

## 2016-08-25 NOTE — Patient Instructions (Signed)
Infertility Infertility is when you are unable to get pregnant (conceive) after a year of having sex regularly without using birth control. Infertility can also mean that a woman is not able to carry a pregnancy to full term. Both women and men can have fertility problems. What causes infertility? What Causes Infertility in Women?  There are many possible causes of infertility in women. For some women, the cause of infertility is not known (unexplained infertility). Infertility can also be linked to more than one cause. Infertility problems in women can be caused by problems with the menstrual cycle or reproductive organs, certain medical conditions, and factors related to lifestyle and age.  Problems with your menstrual cycle can interfere with your ovaries producing eggs (ovulation). This can make it difficult to get pregnant. This includes having a menstrual cycle that is very long, very short, or irregular.  Problems with reproductive organs can include:  An abnormally narrow cervix or a cervix that does not remain closed during a pregnancy.  A blockage in your fallopian tubes.  An abnormally shaped uterus.  Uterine fibroids. This is a tissue mass (tumor) that can develop on your uterus.  Medical conditions that can affect a woman's fertility include:  Polycystic ovarian syndrome (PCOS). This is a hormonal disorder that can cause small cysts to grow on your ovaries. This is the most common cause of infertility in women.  Endometriosis. This is a condition in which the tissue that lines your uterus (endometrium) grows outside of its normal location.  Primary ovary insufficiency. This is when your ovaries stop producing eggs and hormones before the age of 40.  Sexually transmitted diseases, such as chlamydia or gonorrhea. These infections can cause scarring in your fallopian tubes. This makes it difficult for eggs to reach your uterus.  Autoimmune disorders. These are disorders in  which your immune system attacks normal, healthy cells.  Hormone imbalances.  Other factors include:  Age. A woman's fertility declines with age, especially after her mid-30s.  Being under- or overweight.  Drinking too much alcohol.  Using drugs.  Exercising excessively.  Being exposed to environmental toxins, such as radiation, pesticides, and certain chemicals. What Causes Infertility in Men?  There are many causes of infertility in men. Infertility can be linked to more than one cause. Infertility problems in men can be caused by problems with sperm or the reproductive organs, certain medical conditions, and factors related to lifestyle and age. Some men have unexplained infertility.  Problems with sperm. Infertility can result if there is a problem producing:  Enough sperm (low sperm count).  Enough normally-shaped sperm (sperm morphology).  Sperm that are able to reach the egg (poor motility).  Infertility can also be caused by:  A problem with hormones.  Enlarged veins (varicoceles), cysts (spermatoceles), or tumors of the testicles.  Sexual dysfunction.  Injury to the testicles.  A birth defect, such as not having the tubes that carry sperm (vas deferens).  Medical conditions that can affect a man's fertility include:  Diabetes.  Cancer treatments, such as chemotherapy or radiation.  Klinefelter syndrome. This is an inherited genetic disorder.  Thyroid problems, such as an under- or overactive thyroid.  Cystic fibrosis.  Sexually transmitted diseases.  Other factors include:  Age. A man's fertility declines with age.  Drinking too much alcohol.  Using drugs.  Being exposed to environmental toxins, such as pesticides and lead. What are the symptoms of infertility? Being unable to get pregnant after one year of having regular   sex without using birth control is the only sign of infertility. How is infertility diagnosed? In order to be diagnosed  with infertility, both partners will have a physical exam. Both partners will also have an extensive medical and sexual history taken. If there is no obvious reason for infertility, additional tests may be done. What Tests Will Women Have?  Women may first have tests to check whether they are ovulating each month. The tests may include:  Blood tests to check hormone levels.  An ultrasound of the ovaries. This looks for possible problems on or in the ovaries.  Taking a small sample of the tissue that lines the uterus for examination under a microscope (endometrial biopsy). Women who are ovulating may have additional tests. These may include:  Hysterosalpingography.  This is an X-ray of the fallopian tubes and uterus taken after a specific type of dye is injected.  This test can show the shape of the uterus and whether the fallopian tubes are open.  Laparoscopy.  In this test, a lighted tube (laparoscope) is used to look for problems in the fallopian tubes and other female organs.  Transvaginal ultrasound.  This is an imaging test to check for abnormalities of the uterus and ovaries.  A health care provider can use this test to count the number of follicles on the ovaries.  Hysteroscopy.  This test involves using a lighted tube to examine the cervix and inside the uterus.  It is done to find any abnormalities inside the uterus. What Tests Will Men Have?  Tests for men's infertility includes:  Semen tests to check sperm count, morphology, and motility.  Blood tests to check for hormone levels.  Taking a small sample of tissue from inside a testicle (biopsy). This is examined under a microscope.  Blood tests to check for genetic abnormalities (genetic testing). How are women treated for infertility? Treatment depends on the cause of infertility. Most cases of infertility in women are treated with medicine or surgery.  Women may take medicine to:  Correct ovulation  problems.  Treat other health conditions, such as PCOS.  Surgery may be done to:  Repair damage to the ovaries, fallopian tubes, cervix, or uterus.  Remove growths from the uterus.  Remove scar tissue from the uterus, pelvis, or other female organs. How are men treated for infertility? Treatment depends on the cause of infertility. Most cases of infertility in men are treated with medicine or surgery.  Men may take medicine to:  Correct hormone problems.  Treat other health conditions.  Treat sexual dysfunction.  Surgery may be done to:  Remove blockages in the reproductive tract.  Correct other structural problems of the reproductive tract. What is assisted reproductive technology? Assisted reproductive technology (ART) refers to all treatments and procedures that combine eggs and sperm outside the body to try to help a couple conceive. ART is often combined with fertility drugs to stimulate ovulation. Sometimes ART is done using eggs retrieved from another woman's body (donor eggs) or from previously frozen fertilized eggs (embryos). There are different types of ART. These include:  Intrauterine insemination (IUI).  In this procedure, sperm is placed directly into a woman's uterus with a long, thin tube.  This may be most effective for infertility caused by sperm problems, including low sperm count and low motility.  Can be used in combination with fertility drugs.  In vitro fertilization (IVF).  This is often done when a woman's fallopian tubes are blocked or when a man has   low sperm counts.  Fertility drugs stimulate the ovaries to produce multiple eggs. Once mature, these eggs are removed from the body and combined with the sperm to be fertilized.  These fertilized eggs are then placed in the woman's uterus. This information is not intended to replace advice given to you by your health care provider. Make sure you discuss any questions you have with your health care  provider. Document Released: 05/29/2003 Document Revised: 10/26/2015 Document Reviewed: 02/08/2014 Elsevier Interactive Patient Education  2017 Elsevier Inc.  

## 2016-08-28 ENCOUNTER — Encounter: Payer: Self-pay | Admitting: *Deleted

## 2016-09-09 ENCOUNTER — Telehealth: Payer: Self-pay | Admitting: *Deleted

## 2016-09-09 NOTE — Telephone Encounter (Signed)
Pt called in stating she complete Megace round to stop bleeding. She has now waited the specified time and started Provera to start her cycle. She has compelted 7/10 days of treatment and has only seen light [place] spotting. Advised pt to complete 10 days of Provera and if no cycle within 7-10 days after Provera is finished then let us know. Pt expressed understanding.

## 2016-09-23 ENCOUNTER — Encounter: Payer: Self-pay | Admitting: Family Medicine

## 2016-09-29 ENCOUNTER — Ambulatory Visit (INDEPENDENT_AMBULATORY_CARE_PROVIDER_SITE_OTHER): Payer: 59 | Admitting: Primary Care

## 2016-09-29 ENCOUNTER — Encounter: Payer: Self-pay | Admitting: Primary Care

## 2016-09-29 VITALS — BP 126/84 | HR 87 | Temp 97.9°F | Ht 66.5 in | Wt 298.8 lb

## 2016-09-29 DIAGNOSIS — E781 Pure hyperglyceridemia: Secondary | ICD-10-CM | POA: Diagnosis not present

## 2016-09-29 DIAGNOSIS — K219 Gastro-esophageal reflux disease without esophagitis: Secondary | ICD-10-CM

## 2016-09-29 DIAGNOSIS — Z3169 Encounter for other general counseling and advice on procreation: Secondary | ICD-10-CM | POA: Diagnosis not present

## 2016-09-29 LAB — LIPID PANEL
Cholesterol: 171 mg/dL (ref 0–200)
HDL: 32 mg/dL — ABNORMAL LOW (ref >39.00)
LDL CALC: 108 mg/dL — AB (ref 0–99)
NONHDL: 138.77
Total CHOL/HDL Ratio: 5
Triglycerides: 153 mg/dL — ABNORMAL HIGH (ref 0.0–149.0)
VLDL: 30.6 mg/dL (ref 0.0–40.0)

## 2016-09-29 NOTE — Assessment & Plan Note (Signed)
Following with GYN, continue current regimen. Discussed the need for weight loss to improve pregnancy chances.

## 2016-09-29 NOTE — Patient Instructions (Signed)
Complete lab work prior to leaving today. I will notify you of your results once received.   Start exercising. You should be getting 150 minutes of moderate intensity exercise weekly.  It's important to improve your diet by reducing consumption of processed snack foods, sugary drinks. Increase consumption of fresh vegetables and fruits, whole grains, water.  Ensure you are drinking 64 ounces of water daily.  It was a pleasure to meet you today! Please don't hesitate to call me with any questions. Welcome to [facility]!  Food Choices to Lower Your Triglycerides Triglycerides are a type of fat in your blood. High levels of triglycerides can increase the risk of heart disease and stroke. If your triglyceride levels are high, the foods you eat and your eating habits are very important. Choosing the right foods can help lower your triglycerides. What general guidelines do I need to follow?  Lose weight if you are overweight.  Limit or avoid alcohol.  Fill one half of your plate with vegetables and green salads.  Limit fruit to two servings a day. Choose fruit instead of juice.  Make one fourth of your plate whole grains. Look for the word "whole" as the first word in the ingredient list.  Fill one fourth of your plate with lean protein foods.  Enjoy fatty fish (such as salmon, mackerel, sardines, and tuna) three times a week.  Choose healthy fats.  Limit foods high in starch and sugar.  Eat more home-cooked food and less restaurant, buffet, and fast food.  Limit fried foods.  Cook foods using methods other than frying.  Limit saturated fats.  Check ingredient lists to avoid foods with partially hydrogenated oils (trans fats) in them. What foods can I eat? Grains  Whole grains, such as whole wheat or whole grain breads, crackers, cereals, and pasta. Unsweetened oatmeal, bulgur, barley, quinoa, or [place] rice. Corn or whole wheat flour tortillas. Vegetables  Fresh or frozen  vegetables (raw, steamed, roasted, or grilled). Green salads. Fruits  All fresh, canned (in natural juice), or frozen fruits. Meat and Other Protein Products  Ground beef (85% or leaner), grass-fed beef, or beef trimmed of fat. Skinless chicken or [place]. Ground chicken or [place]. Pork trimmed of fat. All fish and seafood. Eggs. Dried beans, peas, or lentils. Unsalted nuts or seeds. Unsalted canned or dry beans. Dairy  Low-fat dairy products, such as skim or 1% milk, 2% or reduced-fat cheeses, low-fat ricotta or cottage cheese, or plain low-fat yogurt. Fats and Oils  Tub margarines without trans fats. Light or reduced-fat mayonnaise and salad dressings. Avocado. Safflower, olive, or canola oils. Natural peanut or almond butter. The items listed above may not be a complete list of recommended foods or beverages. Contact your dietitian for more options.  What foods are not recommended? Grains  White bread. White pasta. White rice. Cornbread. Bagels, pastries, and croissants. Crackers that contain trans fat. Vegetables  White potatoes. Corn. Creamed or fried vegetables. Vegetables in a cheese sauce. Fruits  Dried fruits. Canned fruit in light or heavy syrup. Fruit juice. Meat and Other Protein Products  Fatty cuts of meat. Ribs, chicken wings, bacon, sausage, bologna, salami, chitterlings, fatback, hot dogs, bratwurst, and packaged luncheon meats. Dairy  Whole or 2% milk, cream, half-and-half, and cream cheese. Whole-fat or sweetened yogurt. Full-fat cheeses. Nondairy creamers and whipped toppings. Processed cheese, cheese spreads, or cheese curds. Sweets and Desserts  Corn syrup, sugars, honey, and molasses. Candy. Jam and jelly. Syrup. Sweetened cereals. Cookies, pies, cakes, donuts, muffins,  and ice cream. Fats and Oils  Butter, stick margarine, lard, shortening, ghee, or bacon fat. Coconut, palm kernel, or palm oils. Beverages  Alcohol. Sweetened drinks (such as sodas, lemonade, and fruit  drinks or punches). The items listed above may not be a complete list of foods and beverages to avoid. Contact your dietitian for more information.  This information is not intended to replace advice given to you by your health care provider. Make sure you discuss any questions you have with your health care provider. Document Released: 03/13/2004 Document Revised: 11/01/2015 Document Reviewed: 03/30/2013 Elsevier Interactive Patient Education  2017 [employer].

## 2016-09-29 NOTE — Assessment & Plan Note (Signed)
Discussed the importance of a healthy diet and regular exercise in order for weight loss, and to reduce the risk of other medical diseases.  

## 2016-09-29 NOTE — Progress Notes (Signed)
Pre visit review using our clinic review tool, if applicable. No additional management support is needed unless otherwise documented below in the visit note. 

## 2016-09-29 NOTE — Progress Notes (Addendum)
Subjective:    Patient ID: Jenna Bailey, female    DOB: 1991/02/15, 26 y.o.   MRN: [id number]  HPI  [name] is a 26 year old female who presents today to establish care and discuss the problems mentioned below. Will obtain old records.  1) GERD: Diagnosed at age 14. Currently managed on omeprazole 40 mg daily. She experiences symptoms of esophageal burning, epigastric fullness/pain without her medication. She has no symptoms on the 40 mg dose. She's tried other medications, including a reduced dose of omeprazole, without improvement.   2) Infertility Treatment: Currently following with GYN and managed on Femara, Provera, Megace.   3) Hypertriglyceridemia: Lipid panel in January 2018 with Trigs of 273. She's not been taking Fish Oil recently.   Diet currently consists of:  Breakfast: Skips Lunch: Sandwich, peanut butter crackers, protein bars, pretzels  Dinner: Meat, starches (mac and cheese, potatoes), beans Snacks: Occasionally, popcorn Desserts: 3-4 days weekly Beverages: Water, occasional soda  Exercise: She does not currently exercise.   Review of Systems  Eyes: Negative for visual disturbance.  Respiratory: Negative for shortness of breath.   Cardiovascular: Negative for chest pain.  Gastrointestinal:       Well controlled GERD  Neurological: Negative for dizziness and headaches.       Past Medical History:  Diagnosis Date  . Chicken pox   . GERD (gastroesophageal reflux disease)      Social History   Social History  . Marital status: Married    Spouse name: N/A  . Number of children: N/A  . Years of education: N/A   Occupational History  . Not on file.   Social History Main Topics  . Smoking status: Never Smoker  . Smokeless tobacco: Never Used  . Alcohol use No  . Drug use: No  . Sexual activity: Yes    Birth control/ protection: None   Other Topics Concern  . Not on file   Social History Narrative   Married.   No children, undergoing  fertility treatments.   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   Enjoys reading, traveling, watching TV.    Past Surgical History:  Procedure Laterality Date  . WISDOM TOOTH EXTRACTION      Family History  Problem Relation Age of Onset  . Diabetes Paternal Grandmother   . Depression Father   . Alcohol abuse Father   . Hypertension Mother     No Known Allergies  Current Outpatient Prescriptions on File Prior to Visit  Medication Sig Dispense Refill  . ferrous sulfate 325 (65 FE) MG tablet Take 325 mg by mouth daily with breakfast.    . Omega-3 Fatty Acids (FISH OIL PO) Take 1 capsule by mouth daily.     Marland Kitchen omeprazole (PRILOSEC) 40 MG capsule TAKE 1 CAPSULE BY MOUTH EVERY DAY 90 capsule 3  . Vitamin D, Ergocalciferol, (DRISDOL) 50000 units CAPS capsule Take 50,000 Units by mouth every 7 (seven) days.    Marland Kitchen letrozole (FEMARA) 2.5 MG tablet Take 1 tablet (2.5 mg total) by mouth daily. Take day 3-7 (Patient not taking: Reported on 09/29/2016) 30 tablet 5  . medroxyPROGESTERone (PROVERA) 10 MG tablet Take 1 tablet (10 mg total) by mouth daily. (Patient not taking: Reported on 09/29/2016) 10 tablet 3  . megestrol (MEGACE) 40 MG tablet Take 1 tablet (40 mg total) by mouth 2 (two) times daily. (Patient not taking: Reported on 09/29/2016) 60 tablet 3   No current facility-administered medications on file prior  to visit.     BP 126/84   Pulse 87   Temp 97.9 F (36.6 C) (Oral)   Ht 5' 6.5" (1.689 m)   Wt 298 lb 12.8 oz (135.5 kg)   LMP 09/20/2016   SpO2 96%   BMI 47.51 kg/m    Objective:   Physical Exam  Constitutional: She appears well-nourished.  Neck: Neck supple.  Cardiovascular: Normal rate and regular rhythm.   Pulmonary/Chest: Effort normal and breath sounds normal.  Skin: Skin is warm and dry.  Psychiatric: She has a normal mood and affect.          Assessment & Plan:

## 2016-09-29 NOTE — Assessment & Plan Note (Signed)
Managed on high dose PPI, discussed long term effects of this medication. Cannot tolerate reduced dose or H2 Blocker. Continue for now, work on weight loss and improved diet.

## 2016-09-29 NOTE — Assessment & Plan Note (Signed)
Noted on labs from 06/2016. Check lipids today, discussed proper diet and to start exercising. Information provided on diet. Will likely have her resume Fish Oil.

## 2016-10-06 ENCOUNTER — Ambulatory Visit: Payer: 59 | Admitting: Obstetrics & Gynecology

## 2016-10-13 ENCOUNTER — Encounter: Payer: Self-pay | Admitting: Family Medicine

## 2016-11-12 ENCOUNTER — Ambulatory Visit (INDEPENDENT_AMBULATORY_CARE_PROVIDER_SITE_OTHER): Payer: 59 | Admitting: *Deleted

## 2016-11-12 DIAGNOSIS — Z111 Encounter for screening for respiratory tuberculosis: Secondary | ICD-10-CM

## 2016-11-14 ENCOUNTER — Encounter: Payer: Self-pay | Admitting: *Deleted

## 2016-11-14 LAB — TB SKIN TEST
Induration: 0 mm
TB Skin Test: NEGATIVE

## 2016-11-17 ENCOUNTER — Ambulatory Visit: Payer: 59 | Admitting: Family Medicine

## 2016-12-01 ENCOUNTER — Encounter: Payer: Self-pay | Admitting: Family Medicine

## 2016-12-25 ENCOUNTER — Other Ambulatory Visit: Payer: Self-pay

## 2016-12-25 MED ORDER — OMEPRAZOLE 40 MG PO CPDR: DELAYED_RELEASE_CAPSULE | ORAL | 5 refills | Status: DC

## 2016-12-25 NOTE — Telephone Encounter (Signed)
Pt request omeprazole 40 mg # 30 to [facility] [address]. Pt established care on 09/29/16. Refilled per protocol. Pt voiced understanding.

## 2017-06-15 ENCOUNTER — Ambulatory Visit: Payer: 59 | Admitting: Family Medicine

## 2017-06-15 ENCOUNTER — Encounter: Payer: Self-pay | Admitting: Family Medicine

## 2017-06-15 VITALS — BP 124/84 | HR 100 | Wt 308.6 lb

## 2017-06-15 DIAGNOSIS — Z3169 Encounter for other general counseling and advice on procreation: Secondary | ICD-10-CM

## 2017-06-15 DIAGNOSIS — N939 Abnormal uterine and vaginal bleeding, unspecified: Secondary | ICD-10-CM

## 2017-06-15 MED ORDER — MEDROXYPROGESTERONE ACETATE 10 MG PO TABS: 10.0000 mg | ORAL_TABLET | Freq: Every day | ORAL | 3 refills | Status: DC

## 2017-06-15 MED ORDER — LETROZOLE 2.5 MG PO TABS: 5.0000 mg | ORAL_TABLET | Freq: Every day | ORAL | 5 refills | Status: DC

## 2017-06-15 NOTE — Progress Notes (Signed)
   Subjective:    Patient ID: Jenna MaidensKatie K Bailey is a 27 y.o. female presenting with Follow-up (Infertity)  on 06/15/2017  HPI: Has been on Femara x 6 months with no results. She was still needing Provera to induce cycles, so likely not ovulating. Stopped her meds and has not had a cycle since early November.  Review of Systems  Constitutional: Negative for chills and fever.  Respiratory: Negative for shortness of breath.   Cardiovascular: Negative for chest pain.  Gastrointestinal: Negative for abdominal pain, nausea and vomiting.  Genitourinary: Negative for dysuria.  Skin: Negative for rash.      Objective:    BP 124/84   Pulse 100   Wt (!) 308 lb 9.6 oz (140 kg)   LMP 04/07/2017   BMI 49.06 kg/m  Physical Exam  Constitutional: She is oriented to person, place, and time. She appears well-developed and well-nourished. No distress.  Morbidly obese  HENT:  Head: Normocephalic and atraumatic.  Eyes: No scleral icterus.  Neck: Neck supple.  Cardiovascular: Normal rate.  Pulmonary/Chest: Effort normal.  Abdominal: Soft.  Neurological: She is alert and oriented to person, place, and time.  Skin: Skin is warm and dry.  Psychiatric: She has a normal mood and affect.        Assessment & Plan:   Problem List Items Addressed This Visit      Unprioritized   Infertility counseling - Primary    Obtain day 5 HSG, semen analysis. Increase Femara to 5 mg day 3-7. Consider REI referral if no pregnancy in 12 months. Also discussed need to increase her Femara possibly.      Relevant Medications   letrozole (FEMARA) 2.5 MG tablet   Other Relevant Orders   DG Hysterogram (HSG)   Abnormal uterine bleeding   Relevant Medications   medroxyPROGESTERone (PROVERA) 10 MG tablet      Total face-to-face time with patient: 15 minutes. Over 50% of encounter was spent on counseling and coordination of care. Return in about 6 months (around 12/13/2017).  Reva Boresanya S Pluma Diniz 06/15/2017 2:34 PM

## 2017-06-15 NOTE — Assessment & Plan Note (Signed)
Obtain day 5 HSG, semen analysis. Increase Femara to 5 mg day 3-7. Consider REI referral if no pregnancy in 12 months. Also discussed need to increase her Femara possibly.

## 2017-06-15 NOTE — Patient Instructions (Signed)
Infertility  Infertility is when you are unable to get pregnant (conceive) after a year of having sex regularly without using birth control. Infertility can also mean that a woman is not able to carry a pregnancy to full term.  Both women and men can have fertility problems.  What causes infertility?  What Causes Infertility in Women?  There are many possible causes of infertility in women. For some women, the cause of infertility is not known (unexplained infertility). Infertility can also be linked to more than one cause. Infertility problems in women can be caused by problems with the menstrual cycle or reproductive organs, certain medical conditions, and factors related to lifestyle and age.   Problems with your menstrual cycle can interfere with your ovaries producing eggs (ovulation). This can make it difficult to get pregnant. This includes having a menstrual cycle that is very long, very short, or irregular.   Problems with reproductive organs can include:  ? An abnormally narrow cervix or a cervix that does not remain closed during a pregnancy.  ? A blockage in your fallopian tubes.  ? An abnormally shaped uterus.  ? Uterine fibroids. This is a tissue mass (tumor) that can develop on your uterus.   Medical conditions that can affect a woman's fertility include:  ? Polycystic ovarian syndrome (PCOS). This is a hormonal disorder that can cause small cysts to grow on your ovaries. This is the most common cause of infertility in women.  ? Endometriosis. This is a condition in which the tissue that lines your uterus (endometrium) grows outside of its normal location.  ? Primary ovary insufficiency. This is when your ovaries stop producing eggs and hormones before the age of 40.  ? Sexually transmitted diseases, such as chlamydia or gonorrhea. These infections can cause scarring in your fallopian tubes. This makes it difficult for eggs to reach your uterus.  ? Autoimmune disorders. These are disorders in which  your immune system attacks normal, healthy cells.  ? Hormone imbalances.   Other factors include:  ? Age. A woman's fertility declines with age, especially after her mid-30s.  ? Being under- or overweight.  ? Drinking too much alcohol.  ? Using drugs.  ? Exercising excessively.  ? Being exposed to environmental toxins, such as radiation, pesticides, and certain chemicals.    What Causes Infertility in Men?  There are many causes of infertility in men. Infertility can be linked to more than one cause. Infertility problems in men can be caused by problems with sperm or the reproductive organs, certain medical conditions, and factors related to lifestyle and age. Some men have unexplained infertility.   Problems with sperm. Infertility can result if there is a problem producing:  ? Enough sperm (low sperm count).  ? Enough normally-shaped sperm (sperm morphology).  ? Sperm that are able to reach the egg (poor motility).   Infertility can also be caused by:  ? A problem with hormones.  ? Enlarged veins (varicoceles), cysts (spermatoceles), or tumors of the testicles.  ? Sexual dysfunction.  ? Injury to the testicles.  ? A birth defect, such as not having the tubes that carry sperm (vas deferens).   Medical conditions that can affect a man's fertility include:  ? Diabetes.  ? Cancer treatments, such as chemotherapy or radiation.  ? Klinefelter syndrome. This is an inherited genetic disorder.  ? Thyroid problems, such as an under- or overactive thyroid.  ? Cystic fibrosis.  ? Sexually transmitted diseases.   Other factors   include:  ? Age. A man's fertility declines with age.  ? Drinking too much alcohol.  ? Using drugs.  ? Being exposed to environmental toxins, such as pesticides and lead.    What are the symptoms of infertility?  Being unable to get pregnant after one year of having regular sex without using birth control is the only sign of infertility.  How is infertility diagnosed?  In order to be diagnosed with  infertility, both partners will have a physical exam. Both partners will also have an extensive medical and sexual history taken. If there is no obvious reason for infertility, additional tests may be done.  What Tests Will Women Have?  Women may first have tests to check whether they are ovulating each month. The tests may include:   Blood tests to check hormone levels.   An ultrasound of the ovaries. This looks for possible problems on or in the ovaries.   Taking a small sample of the tissue that lines the uterus for examination under a microscope (endometrial biopsy).    Women who are ovulating may have additional tests. These may include:   Hysterosalpingography.  ? This is an X-ray of the fallopian tubes and uterus taken after a specific type of dye is injected.  ? This test can show the shape of the uterus and whether the fallopian tubes are open.   Laparoscopy.  ? In this test, a lighted tube (laparoscope) is used to look for problems in the fallopian tubes and other female organs.   Transvaginal ultrasound.  ? This is an imaging test to check for abnormalities of the uterus and ovaries.  ? A health care provider can use this test to count the number of follicles on the ovaries.   Hysteroscopy.  ? This test involves using a lighted tube to examine the cervix and inside the uterus.  ? It is done to find any abnormalities inside the uterus.    What Tests Will Men Have?  Tests for men's infertility includes:   Semen tests to check sperm count, morphology, and motility.   Blood tests to check for hormone levels.   Taking a small sample of tissue from inside a testicle (biopsy). This is examined under a microscope.   Blood tests to check for genetic abnormalities (genetic testing).    How are women treated for infertility?  Treatment depends on the cause of infertility. Most cases of infertility in women are treated with medicine or surgery.   Women may take medicine to:  ? Correct ovulation  problems.  ? Treat other health conditions, such as PCOS.   Surgery may be done to:  ? Repair damage to the ovaries, fallopian tubes, cervix, or uterus.  ? Remove growths from the uterus.  ? Remove scar tissue from the uterus, pelvis, or other female organs.    How are men treated for infertility?  Treatment depends on the cause of infertility. Most cases of infertility in men are treated with medicine or surgery.   Men may take medicine to:  ? Correct hormone problems.  ? Treat other health conditions.  ? Treat sexual dysfunction.   Surgery may be done to:  ? Remove blockages in the reproductive tract.  ? Correct other structural problems of the reproductive tract.    What is assisted reproductive technology?  Assisted reproductive technology (ART) refers to all treatments and procedures that combine eggs and sperm outside the body to try to help a couple conceive. ART is often   combined with fertility drugs to stimulate ovulation. Sometimes ART is done using eggs retrieved from another woman's body (donor eggs) or from previously frozen fertilized eggs (embryos).  There are different types of ART. These include:   Intrauterine insemination (IUI).  ? In this procedure, sperm is placed directly into a woman's uterus with a long, thin tube.  ? This may be most effective for infertility caused by sperm problems, including low sperm count and low motility.  ? Can be used in combination with fertility drugs.   In vitro fertilization (IVF).  ? This is often done when a woman's fallopian tubes are blocked or when a man has low sperm counts.  ? Fertility drugs stimulate the ovaries to produce multiple eggs. Once mature, these eggs are removed from the body and combined with the sperm to be fertilized.  ? These fertilized eggs are then placed in the woman's uterus.    This information is not intended to replace advice given to you by your health care provider. Make sure you discuss any questions you have with your  health care provider.  Document Released: 05/29/2003 Document Revised: 10/26/2015 Document Reviewed: 02/08/2014  Elsevier Interactive Patient Education  2018 Elsevier Inc.

## 2017-06-19 ENCOUNTER — Other Ambulatory Visit: Payer: Self-pay | Admitting: Primary Care

## 2017-07-03 ENCOUNTER — Other Ambulatory Visit (INDEPENDENT_AMBULATORY_CARE_PROVIDER_SITE_OTHER): Payer: 59

## 2017-07-03 ENCOUNTER — Encounter: Payer: Self-pay | Admitting: Primary Care

## 2017-07-03 ENCOUNTER — Other Ambulatory Visit: Payer: Self-pay | Admitting: Primary Care

## 2017-07-03 ENCOUNTER — Ambulatory Visit (INDEPENDENT_AMBULATORY_CARE_PROVIDER_SITE_OTHER): Payer: 59 | Admitting: Primary Care

## 2017-07-03 VITALS — BP 120/82 | HR 88 | Temp 98.0°F | Ht 66.5 in | Wt 313.4 lb

## 2017-07-03 DIAGNOSIS — Z3169 Encounter for other general counseling and advice on procreation: Secondary | ICD-10-CM

## 2017-07-03 DIAGNOSIS — E559 Vitamin D deficiency, unspecified: Secondary | ICD-10-CM | POA: Diagnosis not present

## 2017-07-03 DIAGNOSIS — Z0000 Encounter for general adult medical examination without abnormal findings: Secondary | ICD-10-CM

## 2017-07-03 DIAGNOSIS — K219 Gastro-esophageal reflux disease without esophagitis: Secondary | ICD-10-CM

## 2017-07-03 DIAGNOSIS — Z0001 Encounter for general adult medical examination with abnormal findings: Secondary | ICD-10-CM | POA: Insufficient documentation

## 2017-07-03 LAB — COMPREHENSIVE METABOLIC PANEL
ALBUMIN: 4.1 g/dL (ref 3.5–5.2)
ALT: 48 U/L — ABNORMAL HIGH (ref 0–35)
AST: 46 U/L — AB (ref 0–37)
Alkaline Phosphatase: 68 U/L (ref 39–117)
BUN: 10 mg/dL (ref 6–23)
CHLORIDE: 99 meq/L (ref 96–112)
CO2: 29 mEq/L (ref 19–32)
CREATININE: 0.6 mg/dL (ref 0.40–1.20)
Calcium: 9.4 mg/dL (ref 8.4–10.5)
GFR: 126.67 mL/min (ref >60.00)
GLUCOSE: 78 mg/dL (ref 70–99)
Potassium: 4 mEq/L (ref 3.5–5.1)
SODIUM: 137 meq/L (ref 135–145)
Total Bilirubin: 0.5 mg/dL (ref 0.2–1.2)
Total Protein: 7.4 g/dL (ref 6.0–8.3)

## 2017-07-03 LAB — LIPID PANEL
CHOLESTEROL: 146 mg/dL (ref 0–200)
HDL: 32.1 mg/dL — ABNORMAL LOW (ref >39.00)
LDL CALC: 83 mg/dL (ref 0–99)
NONHDL: 114.26
Total CHOL/HDL Ratio: 5
Triglycerides: 154 mg/dL — ABNORMAL HIGH (ref 0.0–149.0)
VLDL: 30.8 mg/dL (ref 0.0–40.0)

## 2017-07-03 NOTE — Assessment & Plan Note (Signed)
Immunizations UTD. Pap smear due based off of our records, she declines and will follow up with her GYN. Discussed the importance of a healthy diet and regular exercise in order for weight loss, and to reduce the risk of any potential medical problems. Exam unremarkable. Labs pending. Follow up in 1 year.

## 2017-07-03 NOTE — Assessment & Plan Note (Signed)
Discussed the importance of a healthy diet and regular exercise in order for weight loss, and to reduce the risk of any potential medical problems.  

## 2017-07-03 NOTE — Assessment & Plan Note (Addendum)
Following with GYN. Managed on Femara and Provera for cycle help/regulation.

## 2017-07-03 NOTE — Assessment & Plan Note (Signed)
Endorses taking 50,000 unit capsules once weekly for nearly one year. Check Vitamin D levels today, consider OTC treatment.

## 2017-07-03 NOTE — Progress Notes (Signed)
Subjective:    Patient ID: Jenna Bailey, female    DOB: [birth year], 27 y.o.   MRN: [id number]  HPI  Jenna Bailey is a 27 year old female who presents today for complete physical.  Immunizations: -Tetanus: Completed in 2015 -Influenza: Completed in October 2018   Diet: She endorses a fair diet. Breakfast: Skips mostly Lunch: Crackers, peanut butter, left overs Dinner: Meat, vegetable, starch Snacks: Chips Desserts: Daily (chocolcate, candy, cookies, cakes) Beverages: Water, sweet tea  Exercise: She is not currently exercising  Eye exam: Completed in May 2018 Dental exam: No recent exam. Pap Smear: Completed in 2016, follows with GYN.    Review of Systems  Constitutional: Negative for unexpected weight change.  HENT: Negative for rhinorrhea.   Respiratory: Negative for cough and shortness of breath.   Cardiovascular: Negative for chest pain.  Gastrointestinal: Negative for constipation and diarrhea.       Infrequent GERD Symptoms  Genitourinary: Negative for difficulty urinating.       Following with GYN for infertility and menstrual cycles  Musculoskeletal: Negative for arthralgias and myalgias.  Skin: Negative for rash.  Allergic/Immunologic: Negative for environmental allergies.  Neurological: Negative for dizziness, numbness and headaches.  Psychiatric/Behavioral:       Denies concerns for anxiety and depression       Past Medical History:  Diagnosis Date  . Chicken pox   . GERD (gastroesophageal reflux disease)      Social History   Socioeconomic History  . Marital status: Married    Spouse name: Not on file  . Number of children: Not on file  . Years of education: Not on file  . Highest education level: Not on file  Social Needs  . Financial resource strain: Not on file  . Food insecurity - worry: Not on file  . Food insecurity - inability: Not on file  . Transportation needs - medical: Not on file  . Transportation needs - non-medical: Not on file    Occupational History  . Not on file  Tobacco Use  . Smoking status: Never Smoker  . Smokeless tobacco: Never Used  Substance and Sexual Activity  . Alcohol use: No  . Drug use: No  . Sexual activity: Yes    Birth control/protection: None  Other Topics Concern  . Not on file  Social History Narrative   Married.   No children, undergoing fertility treatments.   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   Enjoys reading, traveling, watching TV.    Past Surgical History:  Procedure Laterality Date  . WISDOM TOOTH EXTRACTION      Family History  Problem Relation Age of Onset  . Diabetes Paternal Grandmother   . Depression Father   . Alcohol abuse Father   . Hypertension Mother     No Known Allergies  Current Outpatient Medications on File Prior to Visit  Medication Sig Dispense Refill  . ferrous sulfate 325 (65 FE) MG tablet Take 325 mg by mouth daily with breakfast.    . letrozole (FEMARA) 2.5 MG tablet Take 2 tablets (5 mg total) by mouth daily. Take day 3-7 30 tablet 5  . medroxyPROGESTERone (PROVERA) 10 MG tablet Take 1 tablet (10 mg total) by mouth daily. 10 tablet 3  . Omega-3 Fatty Acids (FISH OIL PO) Take 1 capsule by mouth daily.     Marland Kitchen. omeprazole (PRILOSEC) 40 MG capsule TAKE 1 CAPSULE BY MOUTH EVERY DAY 30 capsule 0  . Vitamin D, Ergocalciferol, (DRISDOL)  50000 units CAPS capsule Take 50,000 Units by mouth every 7 (seven) days.     No current facility-administered medications on file prior to visit.     BP 120/82   Pulse 88   Temp 98 F (36.7 C) (Oral)   Ht 5' 6.5" (1.689 m)   Wt (!) 313 lb 6.4 oz (142.2 kg)   LMP 07/03/2017   SpO2 99%   BMI 49.83 kg/m    Objective:   Physical Exam  Constitutional: She is oriented to person, place, and time. She appears well-nourished.  HENT:  Right Ear: Tympanic membrane and ear canal normal.  Left Ear: Tympanic membrane and ear canal normal.  Nose: Nose normal.  Mouth/Throat: Oropharynx is clear and moist.   Eyes: Conjunctivae and EOM are normal. Pupils are equal, round, and reactive to light.  Neck: Neck supple. No thyromegaly present.  Cardiovascular: Normal rate and regular rhythm.  No murmur heard. Pulmonary/Chest: Effort normal and breath sounds normal. She has no rales.  Abdominal: Soft. Bowel sounds are normal. There is no tenderness.  Musculoskeletal: Normal range of motion.  Lymphadenopathy:    She has no cervical adenopathy.  Neurological: She is alert and oriented to person, place, and time. She has normal reflexes. No cranial nerve deficit.  Skin: Skin is warm and dry. No rash noted.  Psychiatric: She has a normal mood and affect.          Assessment & Plan:

## 2017-07-03 NOTE — Patient Instructions (Signed)
I'll be in touch once I receive your lab results.  Start exercising. You should be getting 150 minutes of moderate intensity exercise weekly.  Increase vegetables, fruits, whole grains, lean protein. Limit fast/fried food, processed foods.  Ensure you are consuming 64 ounces of water daily.  Follow up in 1 year for your annual exam or sooner if needed.  It was a pleasure to see you today!   Preventive Care 18-39 Years, Female Preventive care refers to lifestyle choices and visits with your health care provider that can promote health and wellness. What does preventive care include?  A yearly physical exam. This is also called an annual well check.  Dental exams once or twice a year.  Routine eye exams. Ask your health care provider how often you should have your eyes checked.  Personal lifestyle choices, including: ? Daily care of your teeth and gums. ? Regular physical activity. ? Eating a healthy diet. ? Avoiding tobacco and drug use. ? Limiting alcohol use. ? Practicing safe sex. ? Taking vitamin and mineral supplements as recommended by your health care provider. What happens during an annual well check? The services and screenings done by your health care provider during your annual well check will depend on your age, overall health, lifestyle risk factors, and family history of disease. Counseling Your health care provider may ask you questions about your:  Alcohol use.  Tobacco use.  Drug use.  Emotional well-being.  Home and relationship well-being.  Sexual activity.  Eating habits.  Work and work Statistician.  Method of birth control.  Menstrual cycle.  Pregnancy history.  Screening You may have the following tests or measurements:  Height, weight, and BMI.  Diabetes screening. This is done by checking your blood sugar (glucose) after you have not eaten for a while (fasting).  Blood pressure.  Lipid and cholesterol levels. These may be  checked every 5 years starting at age 37.  Skin check.  Hepatitis C blood test.  Hepatitis B blood test.  Sexually transmitted disease (STD) testing.  BRCA-related cancer screening. This may be done if you have a family history of breast, ovarian, tubal, or peritoneal cancers.  Pelvic exam and Pap test. This may be done every 3 years starting at age 84. Starting at age 58, this may be done every 5 years if you have a Pap test in combination with an HPV test.  Discuss your test results, treatment options, and if necessary, the need for more tests with your health care provider. Vaccines Your health care provider may recommend certain vaccines, such as:  Influenza vaccine. This is recommended every year.  Tetanus, diphtheria, and acellular pertussis (Tdap, Td) vaccine. You may need a Td booster every 10 years.  Varicella vaccine. You may need this if you have not been vaccinated.  HPV vaccine. If you are 34 or younger, you may need three doses over 6 months.  Measles, mumps, and rubella (MMR) vaccine. You may need at least one dose of MMR. You may also need a second dose.  Pneumococcal 13-valent conjugate (PCV13) vaccine. You may need this if you have certain conditions and were not previously vaccinated.  Pneumococcal polysaccharide (PPSV23) vaccine. You may need one or two doses if you smoke cigarettes or if you have certain conditions.  Meningococcal vaccine. One dose is recommended if you are age 1-21 years and a first-year college student living in a residence hall, or if you have one of several medical conditions. You may also need additional  booster doses.  Hepatitis A vaccine. You may need this if you have certain conditions or if you travel or work in places where you may be exposed to hepatitis A.  Hepatitis B vaccine. You may need this if you have certain conditions or if you travel or work in places where you may be exposed to hepatitis B.  Haemophilus influenzae type  b (Hib) vaccine. You may need this if you have certain risk factors.  Talk to your health care provider about which screenings and vaccines you need and how often you need them. This information is not intended to replace advice given to you by your health care provider. Make sure you discuss any questions you have with your health care provider. Document Released: 07/22/2001 Document Revised: 02/13/2016 Document Reviewed: 03/27/2015 Elsevier Interactive Patient Education  [employer].

## 2017-07-03 NOTE — Assessment & Plan Note (Signed)
Doing well on omeprazole 40 mg, continues same.

## 2017-07-05 ENCOUNTER — Other Ambulatory Visit: Payer: Self-pay | Admitting: Primary Care

## 2017-07-05 DIAGNOSIS — R945 Abnormal results of liver function studies: Principal | ICD-10-CM

## 2017-07-05 DIAGNOSIS — R7989 Other specified abnormal findings of blood chemistry: Secondary | ICD-10-CM

## 2017-07-06 ENCOUNTER — Ambulatory Visit (HOSPITAL_COMMUNITY)
Admission: RE | Admit: 2017-07-06 | Discharge: 2017-07-06 | Disposition: A | Discharge status: Home / Self Care | Payer: 59 | Source: Ambulatory Visit | Attending: Family Medicine | Admitting: Family Medicine

## 2017-07-06 ENCOUNTER — Inpatient Hospital Stay: Admission: RE | Admit: 2017-07-06 | Payer: 59 | Source: Ambulatory Visit

## 2017-07-06 ENCOUNTER — Other Ambulatory Visit: Payer: Self-pay | Admitting: Primary Care

## 2017-07-06 DIAGNOSIS — N979 Female infertility, unspecified: Secondary | ICD-10-CM | POA: Diagnosis present

## 2017-07-06 DIAGNOSIS — E559 Vitamin D deficiency, unspecified: Secondary | ICD-10-CM

## 2017-07-06 DIAGNOSIS — Z3169 Encounter for other general counseling and advice on procreation: Secondary | ICD-10-CM

## 2017-07-06 LAB — VITAMIN D 25 HYDROXY (VIT D DEFICIENCY, FRACTURES): VITD: 36.04 ng/mL (ref 30.00–100.00)

## 2017-07-06 MED ORDER — IOPAMIDOL (ISOVUE-300) INJECTION 61%
30.0000 mL | Freq: Once | INTRAVENOUS | Status: AC | PRN
  Administered 2017-07-06: 7 mL

## 2017-07-16 ENCOUNTER — Other Ambulatory Visit: Payer: Self-pay | Admitting: Primary Care

## 2017-08-11 ENCOUNTER — Telehealth: Payer: Self-pay

## 2017-08-11 NOTE — Telephone Encounter (Signed)
Patient will pick up a semen collection form so that her husband begin the process of trying to conceive a child. They have been inform to take it to [facility] once it as been obtained.

## 2017-08-18 ENCOUNTER — Telehealth: Payer: Self-pay | Admitting: *Deleted

## 2017-08-18 NOTE — Telephone Encounter (Signed)
Spoke to pt and advised her to call [facility] to see what their preference is as to whether she should make an appointment or just drop off sample. Pt expressed understanding.

## 2017-08-18 NOTE — Telephone Encounter (Signed)
-----   Message from Lindell SparHeather L Bacon, [place] sent at 08/18/2017  9:23 AM EDT ----- Regarding: question about what to do with sperm sample Contact: [phone] Please call patient with where to stake sperm sample, unable to figure out what [facility]

## 2017-10-01 ENCOUNTER — Telehealth: Payer: Self-pay

## 2017-10-01 NOTE — Telephone Encounter (Addendum)
Patient called this am regarding question concerning her ovulating test she has perform at home , she is not sure if she is performing the test correctly. She feels like she is not ovulating at this time. She reports not been about to get her boyfriend to perform an semen analysis as instructed by Dr.Pratt. Patient was very confused about when to start taking the femara and provera. I advised patient it would be better for her to come in for another consult to discuss all her concerns. Patient voice understanding and stated she would call back to make an appointment.

## 2017-11-11 ENCOUNTER — Ambulatory Visit: Payer: 59

## 2017-11-17 ENCOUNTER — Ambulatory Visit (INDEPENDENT_AMBULATORY_CARE_PROVIDER_SITE_OTHER): Payer: 59

## 2017-11-17 DIAGNOSIS — Z111 Encounter for screening for respiratory tuberculosis: Secondary | ICD-10-CM

## 2017-11-19 LAB — TB SKIN TEST
Induration: 0 mm
TB SKIN TEST: NEGATIVE

## 2017-12-02 ENCOUNTER — Ambulatory Visit (INDEPENDENT_AMBULATORY_CARE_PROVIDER_SITE_OTHER): Payer: 59 | Admitting: *Deleted

## 2017-12-02 DIAGNOSIS — Z111 Encounter for screening for respiratory tuberculosis: Secondary | ICD-10-CM

## 2017-12-02 NOTE — Progress Notes (Signed)
Per orders of Olean Reeeborah Gessner, NP, injection of TB skin test given by Shon [place], Shauntay Brunelli M. Patient tolerated injection well.

## 2017-12-04 LAB — TB SKIN TEST
Induration: 0 mm
TB Skin Test: NEGATIVE

## 2018-01-08 ENCOUNTER — Telehealth: Payer: Self-pay | Admitting: Primary Care

## 2018-01-08 NOTE — Telephone Encounter (Signed)
Form completed and placed in Kate's in box for signature.

## 2018-01-08 NOTE — Telephone Encounter (Signed)
Patient dropped off form for her husband's insurance.  Patient had a physical in January. Please call patient when form is ready. Patient needs to send form in by 02/05/18.  Form is in rx tower.  Patient aware Jae DireKate is off until Tuesday.

## 2018-01-12 NOTE — Telephone Encounter (Signed)
Form completed and placed in Chan's inbox. 

## 2018-01-13 NOTE — Telephone Encounter (Signed)
Left a detail message for patient that the form is missing her signature. Patient may come by and sign the form then I will fax it for patient.

## 2018-01-13 NOTE — Telephone Encounter (Signed)
Patient would like a call back to confirm forms was faxed already.

## 2018-01-13 NOTE — Telephone Encounter (Signed)
Left message for patient that form is ready for pick up. Left in the front office. 

## 2018-01-15 NOTE — Telephone Encounter (Signed)
Pt signed form and I placed in Rx tower.

## 2018-01-15 NOTE — Telephone Encounter (Signed)
Faxed form as requested. Patient was notified as well.

## 2018-01-26 ENCOUNTER — Other Ambulatory Visit (INDEPENDENT_AMBULATORY_CARE_PROVIDER_SITE_OTHER): Payer: 59

## 2018-01-26 VITALS — BP 124/76 | HR 106 | Resp 18 | Ht 65.5 in | Wt 311.8 lb

## 2018-01-26 DIAGNOSIS — Z3201 Encounter for pregnancy test, result positive: Secondary | ICD-10-CM

## 2018-01-26 DIAGNOSIS — Z3200 Encounter for pregnancy test, result unknown: Secondary | ICD-10-CM

## 2018-01-26 LAB — POCT URINE PREGNANCY: Preg Test, Ur: POSITIVE — AB

## 2018-01-26 NOTE — Progress Notes (Signed)
Patient seen and assessed by nursing staff.  Agree with documentation and plan.  

## 2018-01-26 NOTE — Progress Notes (Addendum)
Ms. Manson PasseyBrown presents today for UPT. She has no unusual complaints.  LMP: 12/17/17  EDD: 09/23/18   OBJECTIVE: Appears well, in no apparent distress.  OB History    Gravida  0   Para  0   Term  0   Preterm  0   AB  0   Living  0     SAB  0   TAB  0   Ectopic  0   Multiple  0   Live Births  0          Home UPT Result: Positive x3 In-Office UPT result: Positive I have reviewed the patient's medical, obstetrical, social, and family histories, and medications.   ASSESSMENT: Positive pregnancy test  PLAN:  [facility] [place] Prenatal vitamins and begin taking Schedule Initial OB for 10-12 weeks If any unusual acute bleeding, severe cramping, sever nausea and vomiting, shortness of breath, chest pain [facility] and treatment accordingly.

## 2018-02-09 ENCOUNTER — Ambulatory Visit: Payer: 59 | Admitting: Family Medicine

## 2018-02-09 ENCOUNTER — Telehealth: Payer: Self-pay

## 2018-02-09 ENCOUNTER — Encounter: Payer: Self-pay | Admitting: Family Medicine

## 2018-02-09 ENCOUNTER — Ambulatory Visit: Payer: 59 | Admitting: Primary Care

## 2018-02-09 DIAGNOSIS — R21 Rash and other nonspecific skin eruption: Secondary | ICD-10-CM

## 2018-02-09 MED ORDER — PERMETHRIN 5 % EX CREA: 1.0000 "application " | TOPICAL_CREAM | Freq: Once | CUTANEOUS | 0 refills | Status: AC

## 2018-02-09 NOTE — Telephone Encounter (Signed)
I spoke with pt and she is not having any swelling in throat, tongue or mouth and not having any difficulty breathing. Pt will keep appt with Dr Para March today at 11:45.

## 2018-02-09 NOTE — Progress Notes (Signed)
7.[redacted] weeks pregnant.  No VB, no CTX, no movement, no leakage of fluid.  She is going to have f/u with the OB clinic.  On prenatal vitamin.  1st pregnancy.  Feeling well otherwise, except for rash.  Itchy red spots on the trunk and extremities.  No fevers.  No vomiting.  No ST.  She never had sx like this prev.  No other similar contacts.  She is a care giver at a facility.    No known poison ivy exposure.   Meds, vitals, and allergies reviewed.   ROS: Per HPI unless specifically indicated in ROS section   nad ncat Neck supple, no LA rrr ctab abd soft, not ttp Ext w/o edema No lip or tongue swelling.  No stridor. Blanching irregular reddish maculopapular lesions of variable size and distribution on the extremities, with a few lesions noted on the hands, near the webspaces.  Similar lesions also noted on the trunk.  No oral lesions.  No vesicles.

## 2018-02-09 NOTE — Telephone Encounter (Signed)
PLEASE NOTE: All timestamps contained within this report are represented as [place] Standard Time. CONFIDENTIALTY NOTICE: This fax transmission is intended only for the addressee. It contains information that is legally privileged, confidential or otherwise protected from use or disclosure. If you are not the intended recipient, you are strictly prohibited from reviewing, disclosing, copying using or disseminating any of this information or taking any action in reliance on or regarding this information. If you have received this fax in error, please notify us immediately by telephone so that we can arrange for its return to [place]. Phone: [phone], Toll-Free: [phone], Fax: [phone] Page: 1 of 2 Call Id: [id number] [facility] Night - Client TELEPHONE ADVICE RECORD [facility] Medical Call Center Patient Name: Jenna Bailey Gender: Female DOB: Oct 11, 1990 Age: 27 Y 5 M 8 D Return Phone Number: [phone] (Primary) Address: City/State/Zip: [place] [address] Client [facility] Night - Client Client Site  [facility] - Night Physician Vernona Rieger - NP Contact Type Call Who Is Calling Patient / Member / Family / Caregiver Call Type Triage / Clinical Relationship To Patient Self Return Phone Number [phone] (Primary) Chief Complaint Rash - Widespread Reason for Call Request to Schedule Office Appointment Initial Comment Caller states she wants to make an appointment for today if possible. She thinks she might be having an allergic reaction. She has dots all over and it is now on her eye lid. Translation No Nurse Assessment Nurse: Christell Constant, RN, Adela Lank Date/Time (Eastern Time): 02/09/2018 6:47:32 AM Confirm and document reason for call. If symptomatic, describe symptoms. ---She thinks she might be having an allergic reaction. She has a rash on her legs, arms, chest and face and eye lid. Rash is red  and itchy. Caller is also pregnant. Does the patient have any new or worsening symptoms? ---Yes Will a triage be completed? ---Yes Related visit to physician within the last 2 weeks? ---No Does the PT have any chronic conditions? (i.e. diabetes, asthma, this includes High risk factors for pregnancy, etc.) ---Yes List chronic conditions. ---GERD Is the patient pregnant or possibly pregnant? (Ask all females between the ages of 60-55) ---Yes How many weeks gestation? ---7 What is the estimated delivery date? ---2018-09-23 Total number of pregnancies including current? ---1 Number of live births? ---0 Have you felt decreased fetal movement? ---Early Pregnancy - No Fetal Movement Felt Yet Is this a behavioral health or substance abuse call? ---No PLEASE NOTE: All timestamps contained within this report are represented as [place] Standard Time. CONFIDENTIALTY NOTICE: This fax transmission is intended only for the addressee. It contains information that is legally privileged, confidential or otherwise protected from use or disclosure. If you are not the intended recipient, you are strictly prohibited from reviewing, disclosing, copying using or disseminating any of this information or taking any action in reliance on or regarding this information. If you have received this fax in error, please notify us immediately by telephone so that we can arrange for its return to [place]. Phone: [phone], Toll-Free: [phone], Fax: [phone] Page: 2 of 2 Call Id: [id number] Guidelines Guideline Title Affirmed Question Affirmed Notes Nurse Date/Time Lamount Cohen Time) Rash or Redness - Widespread Face becomes swollen Floyce Stakes 02/09/2018 6:52:16 AM Disp. Time Lamount Cohen Time) Disposition Final User 02/09/2018 6:56:04 AM See HCP within 4 Hours (or PCP triage) Yes Christell Constant, RN, Althia Forts Disagree/Comply Comply Caller Understands Yes PreDisposition InappropriateToAsk Care Advice Given Per  Guideline SEE HCP WITHIN 4 HOURS (OR PCP TRIAGE): *  IF OFFICE WILL BE OPEN: You need to be seen within the next 3 or 4 hours. Call your doctor (or NP/PA) now or as soon as the office opens. CALL BACK IF: * You become worse. Referrals REFERRED TO PCP OFFICE

## 2018-02-09 NOTE — Patient Instructions (Signed)
Use the cream tonight and then wash off tomorrow AM.   Wash all the bedding and recently worn clothes.  Oatmeal bath as needed for itching.   Out of work for now.  Update [place] as needed.  Take care.  Glad to see you.

## 2018-02-10 NOTE — Telephone Encounter (Signed)
See OV note.  Thanks.  

## 2018-02-11 DIAGNOSIS — B86 Scabies: Secondary | ICD-10-CM

## 2018-02-11 HISTORY — DX: Scabies: B86

## 2018-02-11 NOTE — Assessment & Plan Note (Signed)
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  The concern is for scabies.  Discussed with patient about options.  Topical treatment would be the drug of choice during pregnancy.  Routine cautions and instructions given.  See after visit summary.  She can use permethrin cream along with oatmeal bath as needed.  All questions answered and she will update me as needed.  Okay for outpatient follow-up.

## 2018-03-08 NOTE — Progress Notes (Signed)
Subjective:   Jenna Bailey is a 27 y.o. G1P0 at [redacted]w[redacted]d by LMP being seen today for her first obstetrical visit.  Her obstetrical history is significant for morbid obesity. Patient does intend to breast feed. Pregnancy history fully reviewed.  Patient reports no current complaints. 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  No other concerns.  HISTORY: OB History  Gravida Para Term Preterm AB Living  1 0 0 0 0 0  SAB TAB Ectopic Multiple Live Births  0 0 0 0 0    # Outcome Date GA Lbr Len/2nd Weight Sex Delivery Anes PTL Lv  1 Current             Last pap smear was done 07/26/2014 and was normal  Past Medical History:  Diagnosis Date  . Abnormal uterine bleeding 08/25/2016  . Chicken pox   . GERD (gastroesophageal reflux disease)   . Hypertriglyceridemia 03/07/2015  . Morbid obesity (HCC) 06/22/2013  . Recurrent boils 07/26/2014  . Scabies 02/11/2018  . Vitamin D deficiency    Past Surgical History:  Procedure Laterality Date  . WISDOM TOOTH EXTRACTION     Family History  Problem Relation Age of Onset  . Diabetes Paternal Grandmother   . Depression Father   . Alcohol abuse Father   . Hypertension Mother    Social History   Tobacco Use  . Smoking status: Never Smoker  . Smokeless tobacco: Never Used  Substance Use Topics  . Alcohol use: No  . Drug use: No   No Known Allergies Current Outpatient Medications on File Prior to Visit  Medication Sig Dispense Refill  . omeprazole (PRILOSEC) 40 MG capsule TAKE 1 CAPSULE BY MOUTH EVERY DAY 30 capsule 11  . Prenatal Vit-Fe Fumarate-FA (PRENATAL VITAMIN PO) Take by mouth daily.    . Vitamin D, Ergocalciferol, (DRISDOL) 50000 units CAPS capsule Take 50,000 Units by mouth every 7 (seven) days.     No current facility-administered medications on file prior to visit.      Review of Systems Pertinent items noted in HPI and remainder of comprehensive ROS otherwise negative.  Exam   Vitals:   03/09/18 1322  BP: 135/82  Pulse: 90  Weight: 298 lb (135.2 kg)   Fetal Heart Rate (bpm): 150  Uterus:  unable to palpate fully secondary to habitus  Pelvic Exam: Perineum: no hemorrhoids, normal perineum   Vulva: normal external genitalia, no lesions   Vagina:  normal mucosa, normal discharge   Cervix: no lesions and normal, pap smear done and had mild bleeding   Adnexa: normal adnexa and no mass, fullness, tenderness   Bony Pelvis: average  System: General: well-developed, well-nourished female in no acute distress   Breast:  normal appearance, no masses or tenderness   Skin: normal coloration and turgor, no rashes   Neurologic: oriented, normal, negative, normal mood   Extremities: normal strength, tone, and muscle mass, ROM of all joints is normal   HEENT PERRLA, extraocular movement intact and sclera clear, anicteric   Mouth/Teeth mucous membranes moist, pharynx normal without lesions and dental hygiene good   Neck supple and no masses   Cardiovascular: regular rate and rhythm   Respiratory:  no respiratory distress, normal breath sounds   Abdomen: soft, non-tender; bowel sounds normal; no masses,  no organomegaly  Assessment:   Pregnancy: G1P0000 Patient Active Problem List   Diagnosis Date Noted  . Supervision of high-risk pregnancy 03/09/2018  . Maternal morbid obesity, antepartum (HCC) 03/09/2018     Plan:  1. Maternal morbid obesity, antepartum (HCC) [x]  Aspirin 81 mg daily after 12 weeks [x]  Nutrition consult [x]  Weight gain 11-20 lbs for singleton [x]  Baseline and surveillance labs [x]  Growth scans every 4-6 weeks as needed (fundal height likely inadequate in morbidly obese patients) - aspirin EC 81 MG tablet; Take 1 tablet (81 mg total) by mouth daily. Take after 12 weeks for prevention of preeclampsia later in pregnancy   Dispense: 300 tablet; Refill: 2 - Comprehensive metabolic panel - Hemoglobin A1c - TSH - Protein / creatinine ratio, urine - Referral to Nutrition and Diabetes Services - Enroll Patient in Babyscripts - Flu Vaccine QUAD 36+ mos IM - [place] MFM Fetal Nuchal Translucency; Future  2. Supervision of high risk pregnancy, antepartum - Culture, OB Urine - Cystic Fibrosis Mutation 97 - Hemoglobinopathy evaluation - Obstetric Panel, Including HIV - Cytology - PAP - SMN1 Copy Number Analysis - [place] MFM Fetal Nuchal Translucency; Future - [place] MFM OB DETAIL +14 WK; Future Initial labs drawn. Continue prenatal vitamins. Genetic Screening discussed, First trimester screen: ordered. Ultrasound discussed; fetal anatomic survey: ordered. Problem list reviewed and updated. The nature of [facility] with multiple MDs and other Advanced Practice Providers was explained to patient; also emphasized that residents, students are part of our team. Routine obstetric precautions reviewed. Return in about 4 weeks (around 04/06/2018) for OB Visit.     Jaynie Collins, MD, FACOG Obstetrician & Gynecologist, [facility] for [employer], [facility]

## 2018-03-09 ENCOUNTER — Ambulatory Visit (INDEPENDENT_AMBULATORY_CARE_PROVIDER_SITE_OTHER): Payer: 59 | Admitting: Obstetrics & Gynecology

## 2018-03-09 ENCOUNTER — Encounter: Payer: Self-pay | Admitting: Obstetrics & Gynecology

## 2018-03-09 VITALS — BP 135/82 | HR 90 | Wt 298.0 lb

## 2018-03-09 DIAGNOSIS — Z113 Encounter for screening for infections with a predominantly sexual mode of transmission: Secondary | ICD-10-CM

## 2018-03-09 DIAGNOSIS — Z23 Encounter for immunization: Secondary | ICD-10-CM | POA: Diagnosis not present

## 2018-03-09 DIAGNOSIS — B3731 Acute candidiasis of vulva and vagina: Secondary | ICD-10-CM

## 2018-03-09 DIAGNOSIS — O99211 Obesity complicating pregnancy, first trimester: Secondary | ICD-10-CM

## 2018-03-09 DIAGNOSIS — Z124 Encounter for screening for malignant neoplasm of cervix: Secondary | ICD-10-CM | POA: Diagnosis not present

## 2018-03-09 DIAGNOSIS — B373 Candidiasis of vulva and vagina: Secondary | ICD-10-CM

## 2018-03-09 DIAGNOSIS — O0990 Supervision of high risk pregnancy, unspecified, unspecified trimester: Secondary | ICD-10-CM | POA: Insufficient documentation

## 2018-03-09 DIAGNOSIS — O99210 Obesity complicating pregnancy, unspecified trimester: Principal | ICD-10-CM

## 2018-03-09 DIAGNOSIS — O0991 Supervision of high risk pregnancy, unspecified, first trimester: Secondary | ICD-10-CM

## 2018-03-09 MED ORDER — ASPIRIN EC 81 MG PO TBEC: 81.0000 mg | DELAYED_RELEASE_TABLET | Freq: Every day | ORAL | 2 refills | Status: DC

## 2018-03-09 NOTE — Patient Instructions (Addendum)
Recommendations [ ]  Aspirin 81 mg daily after 12 weeks [ ]  Nutrition consult [ ]  Weight gain 11-20 lbs  First Trimester of Pregnancy The first trimester of pregnancy is from week 1 until the end of week 13 (months 1 through 3). During this time, your baby will begin to develop inside you. At 6-8 weeks, the eyes and face are formed, and the heartbeat can be seen on ultrasound. At the end of 12 weeks, all the baby's organs are formed. Prenatal care is all the medical care you receive before the birth of your baby. Make sure you get good prenatal care and follow all of your doctor's instructions. Follow these instructions at home: Medicines  Take over-the-counter and prescription medicines only as told by your doctor. Some medicines are safe and some medicines are not safe during pregnancy.  Take a prenatal vitamin that contains at least 600 micrograms (mcg) of folic acid.  If you have trouble pooping (constipation), take medicine that will make your stool soft (stool softener) if your doctor approves. Eating and drinking  Eat regular, healthy meals.  Your doctor will tell you the amount of weight gain that is right for you.  Avoid raw meat and uncooked cheese.  If you feel sick to your stomach (nauseous) or throw up (vomit): ? Eat 4 or 5 small meals a day instead of 3 large meals. ? Try eating a few soda crackers. ? Drink liquids between meals instead of during meals.  To prevent constipation: ? Eat foods that are high in fiber, like fresh fruits and vegetables, whole grains, and beans. ? Drink enough fluids to keep your pee (urine) clear or pale yellow. Activity  Exercise only as told by your doctor. Stop exercising if you have cramps or pain in your lower belly (abdomen) or low back.  Do not exercise if it is too hot, too humid, or if you are in a place of great height (high altitude).  Try to avoid standing for long periods of time. Move your legs often if you must stand in one  place for a long time.  Avoid heavy lifting.  Wear low-heeled shoes. Sit and stand up straight.  You can have sex unless your doctor tells you not to. Relieving pain and discomfort  Wear a good support bra if your breasts are sore.  Take warm water baths (sitz baths) to soothe pain or discomfort caused by hemorrhoids. Use hemorrhoid cream if your doctor says it is okay.  Rest with your legs raised if you have leg cramps or low back pain.  If you have puffy, bulging veins (varicose veins) in your legs: ? Wear support hose or compression stockings as told by your doctor. ? Raise (elevate) your feet for 15 minutes, 3-4 times a day. ? Limit salt in your food. Prenatal care  Schedule your prenatal visits by the twelfth week of pregnancy.  Write down your questions. Take them to your prenatal visits.  Keep all your prenatal visits as told by your doctor. This is important. Safety  Wear your seat belt at all times when driving.  Make a list of emergency phone numbers. The list should include numbers for family, friends, the hospital, and police and fire departments. General instructions  Ask your doctor for a referral to a local prenatal class. Begin classes no later than at the start of month 6 of your pregnancy.  Ask for help if you need counseling or if you need help with nutrition. Your doctor  can give you advice or tell you where to go for help.  Do not use hot tubs, steam rooms, or saunas.  Do not douche or use tampons or scented sanitary pads.  Do not cross your legs for long periods of time.  Avoid all herbs and alcohol. Avoid drugs that are not approved by your doctor.  Do not use any tobacco products, including cigarettes, chewing tobacco, and electronic cigarettes. If you need help quitting, ask your doctor. You may get counseling or other support to help you quit.  Avoid cat litter boxes and soil used by cats. These carry germs that can cause birth defects in the  baby and can cause a loss of your baby (miscarriage) or stillbirth.  Visit your dentist. At home, brush your teeth with a soft toothbrush. Be gentle when you floss. Contact a doctor if:  You are dizzy.  You have mild cramps or pressure in your lower belly.  You have a nagging pain in your belly area.  You continue to feel sick to your stomach, you throw up, or you have watery poop (diarrhea).  You have a bad smelling fluid coming from your vagina.  You have pain when you pee (urinate).  You have increased puffiness (swelling) in your face, hands, legs, or ankles. Get help right away if:  You have a fever.  You are leaking fluid from your vagina.  You have spotting or bleeding from your vagina.  You have very bad belly cramping or pain.  You gain or lose weight rapidly.  You throw up blood. It may look like coffee grounds.  You are around people who have [place] measles, fifth disease, or chickenpox.  You have a very bad headache.  You have shortness of breath.  You have any kind of trauma, such as from a fall or a car accident. Summary  The first trimester of pregnancy is from week 1 until the end of week 13 (months 1 through 3).  To take care of yourself and your unborn baby, you will need to eat healthy meals, take medicines only if your doctor tells you to do so, and do activities that are safe for you and your baby.  Keep all follow-up visits as told by your doctor. This is important as your doctor will have to ensure that your baby is healthy and growing well. This information is not intended to replace advice given to you by your health care provider. Make sure you discuss any questions you have with your health care provider. Document Released: 11/12/2007 Document Revised: 06/03/2016 Document Reviewed: 06/03/2016 Elsevier Interactive Patient Education  2017 [employer].

## 2018-03-10 LAB — COMPREHENSIVE METABOLIC PANEL
ALK PHOS: 80 IU/L (ref 39–117)
ALT: 38 IU/L — AB (ref 0–32)
AST: 49 IU/L — AB (ref 0–40)
Albumin/Globulin Ratio: 1.6 (ref 1.2–2.2)
Albumin: 4.1 g/dL (ref 3.5–5.5)
BUN/Creatinine Ratio: 10 (ref 9–23)
BUN: 5 mg/dL — ABNORMAL LOW (ref 6–20)
Bilirubin Total: 0.3 mg/dL (ref 0.0–1.2)
CALCIUM: 9.5 mg/dL (ref 8.7–10.2)
CO2: 20 mmol/L (ref 20–29)
CREATININE: 0.49 mg/dL — AB (ref 0.57–1.00)
Chloride: 99 mmol/L (ref 96–106)
GFR calc Af Amer: 153 mL/min/{1.73_m2} (ref >59)
GFR, EST NON AFRICAN AMERICAN: 133 mL/min/{1.73_m2} (ref >59)
GLOBULIN, TOTAL: 2.6 g/dL (ref 1.5–4.5)
GLUCOSE: 84 mg/dL (ref 65–99)
Potassium: 4 mmol/L (ref 3.5–5.2)
SODIUM: 137 mmol/L (ref 134–144)
Total Protein: 6.7 g/dL (ref 6.0–8.5)

## 2018-03-10 LAB — OBSTETRIC PANEL, INCLUDING HIV
Antibody Screen: NEGATIVE
BASOS ABS: 0 10*3/uL (ref 0.0–0.2)
Basos: 0 %
EOS (ABSOLUTE): 0 10*3/uL (ref 0.0–0.4)
Eos: 1 %
HEP B S AG: NEGATIVE
HIV SCREEN 4TH GENERATION: NONREACTIVE
Hematocrit: 36.6 % (ref 34.0–46.6)
Hemoglobin: 12 g/dL (ref 11.1–15.9)
IMMATURE GRANULOCYTES: 0 %
Immature Grans (Abs): 0 10*3/uL (ref 0.0–0.1)
LYMPHS ABS: 2.4 10*3/uL (ref 0.7–3.1)
Lymphs: 29 %
MCH: 26.7 pg (ref 26.6–33.0)
MCHC: 32.8 g/dL (ref 31.5–35.7)
MCV: 82 fL (ref 79–97)
MONOS ABS: 0.6 10*3/uL (ref 0.1–0.9)
Monocytes: 7 %
NEUTROS ABS: 5.3 10*3/uL (ref 1.4–7.0)
Neutrophils: 63 %
PLATELETS: 342 10*3/uL (ref 150–450)
RBC: 4.49 x10E6/uL (ref 3.77–5.28)
RDW: 13.6 % (ref 12.3–15.4)
RPR: NONREACTIVE
Rh Factor: POSITIVE
Rubella Antibodies, IGG: 1.1 index (ref >0.99)
WBC: 8.3 10*3/uL (ref 3.4–10.8)

## 2018-03-10 LAB — HEMOGLOBIN A1C
ESTIMATED AVERAGE GLUCOSE: 111 mg/dL
Hgb A1c MFr Bld: 5.5 % (ref 4.8–5.6)

## 2018-03-10 LAB — TSH: TSH: 2.63 u[IU]/mL (ref 0.450–4.500)

## 2018-03-10 LAB — PROTEIN / CREATININE RATIO, URINE
Creatinine, Urine: 42.8 mg/dL
PROTEIN UR: 5.6 mg/dL
Protein/Creat Ratio: 131 mg/g creat (ref 0–200)

## 2018-03-11 ENCOUNTER — Encounter: Payer: Self-pay | Admitting: Obstetrics & Gynecology

## 2018-03-11 DIAGNOSIS — R7989 Other specified abnormal findings of blood chemistry: Secondary | ICD-10-CM | POA: Insufficient documentation

## 2018-03-11 DIAGNOSIS — R945 Abnormal results of liver function studies: Secondary | ICD-10-CM

## 2018-03-11 LAB — CYTOLOGY - PAP
Chlamydia: NEGATIVE
DIAGNOSIS: NEGATIVE
NEISSERIA GONORRHEA: NEGATIVE

## 2018-03-11 LAB — URINE CULTURE, OB REFLEX

## 2018-03-11 LAB — CULTURE, OB URINE

## 2018-03-11 MED ORDER — TERCONAZOLE 0.8 % VA CREA: 1.0000 | TOPICAL_CREAM | Freq: Every day | VAGINAL | 1 refills | Status: DC

## 2018-03-11 NOTE — Addendum Note (Signed)
Addended by: Jaynie Collins A on: 03/11/2018 03:41 PM   Modules accepted: Orders

## 2018-03-16 LAB — SMN1 COPY NUMBER ANALYSIS (SMA CARRIER SCREENING)

## 2018-03-16 LAB — HEMOGLOBINOPATHY EVALUATION
HEMOGLOBIN A2 QUANTITATION: 2.5 % (ref 1.8–3.2)
HEMOGLOBIN F QUANTITATION: 0 % (ref 0.0–2.0)
HGB A: 97.5 % (ref 96.4–98.8)
HGB C: 0 %
HGB S: 0 %
HGB VARIANT: 0 %

## 2018-03-16 LAB — CYSTIC FIBROSIS MUTATION 97: Interpretation: NOT DETECTED

## 2018-03-18 ENCOUNTER — Encounter (HOSPITAL_COMMUNITY): Payer: Self-pay

## 2018-03-25 ENCOUNTER — Other Ambulatory Visit: Payer: Self-pay | Admitting: Obstetrics & Gynecology

## 2018-03-25 ENCOUNTER — Ambulatory Visit (HOSPITAL_COMMUNITY)
Admission: RE | Admit: 2018-03-25 | Discharge: 2018-03-25 | Disposition: A | Discharge status: Home / Self Care | Payer: 59 | Source: Ambulatory Visit | Attending: Obstetrics & Gynecology | Admitting: Obstetrics & Gynecology

## 2018-03-25 ENCOUNTER — Encounter: Payer: Self-pay | Admitting: Registered"

## 2018-03-25 ENCOUNTER — Encounter: Payer: 59 | Attending: Obstetrics & Gynecology | Admitting: Registered"

## 2018-03-25 ENCOUNTER — Encounter (HOSPITAL_COMMUNITY): Payer: Self-pay

## 2018-03-25 DIAGNOSIS — Z3A00 Weeks of gestation of pregnancy not specified: Secondary | ICD-10-CM | POA: Insufficient documentation

## 2018-03-25 DIAGNOSIS — Z3A14 14 weeks gestation of pregnancy: Secondary | ICD-10-CM | POA: Insufficient documentation

## 2018-03-25 DIAGNOSIS — O0990 Supervision of high risk pregnancy, unspecified, unspecified trimester: Secondary | ICD-10-CM

## 2018-03-25 DIAGNOSIS — Z368A Encounter for antenatal screening for other genetic defects: Secondary | ICD-10-CM

## 2018-03-25 DIAGNOSIS — O99210 Obesity complicating pregnancy, unspecified trimester: Secondary | ICD-10-CM

## 2018-03-25 DIAGNOSIS — Z3A13 13 weeks gestation of pregnancy: Secondary | ICD-10-CM

## 2018-03-25 DIAGNOSIS — Z713 Dietary counseling and surveillance: Secondary | ICD-10-CM | POA: Insufficient documentation

## 2018-03-25 DIAGNOSIS — O0992 Supervision of high risk pregnancy, unspecified, second trimester: Secondary | ICD-10-CM | POA: Diagnosis not present

## 2018-03-25 DIAGNOSIS — O99212 Obesity complicating pregnancy, second trimester: Secondary | ICD-10-CM

## 2018-03-25 NOTE — Progress Notes (Signed)
Medical Nutrition Therapy:  Appt start time: 1405 end time:  1455.  This patient is accompanied in the office by her spouse.  Assessment:  Primary concerns today: Patient states she wants to eat healthy during pregnancy. Pt states she has lost weight since finding out she was pregnant, had some nausea that made her not as hungry. (per Pt starting weight 311 lbs, current 296 lbs at 14 weeks) Pt states he has history of TG beening a little high, per Epic last lab TG 154.   Per chart patient has A1c of 5.8% 2 yrs ago and pt states her doctor started metformin but she stopped due to side effects and Pt didn't think she needed it. Recent A1c is 5.5%. Pt states her mother had GDM and her grandmother has T2DM. RD did a brief education on how she can reduce her risk of GDM/T2DM using the basic healthy eating and lifestyle education provided today.  Pt states she has not been getting as much exercise lately but likes to walk outside with dogs. 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  Pt states they eat a lot of beans. Pt states she doesn't eat a lot of fruit, but recently enjoyed going to an apple orchard and picking their own.  Pt states she has a [employer] and does not fry anything. Pt states she will eat fried food when dining out.   Sleep: "not good" 10-6 wakes up 2-3 am, often can go back to sleep, but if concerned about work may take longer to go back to sleep.  Preferred Learning Style:   No preference indicated   Learning Readiness:   Ready  MEDICATIONS: reviewed   DIETARY INTAKE:  Usual eating pattern includes 2 meals and 2-3 snacks per day.  24-hr recall:  B ( AM): none  Snk ( AM): nabs OR cheezits  L ( PM): left overs OR soup Snk ( PM): none OR popcorn or chips D ( PM): meat, bean or vegetable, starch Snk ( PM): none OR popcorn or cereal or ice cream Beverages: water, chocolate milk, soda occasionally,  tea, 100% cranberry juice for UTI  Usual physical activity: ADLs  Estimated energy needs: 2000 calories  Progress Towards Goal(s):  New goals.   Nutritional Diagnosis:  NB-1.1 Food and nutrition-related knowledge deficit As related to MyPlate guidelines.  As evidenced by preparing meals with all the food groups, but not in recommended ratios.    Intervention:  Nutrition  Discussed how exercise may help improve sleep. Discussed MyPlate for Moms and healthy eating during pregnancy.  Plan: Remember to take your vitamin D Cranberry can be taken in the pill form Consider spreading out your meals and getting in breakfast Consider eating balanced meals and you can use the MyPlate for Healthy moms Consider walking 10 min after work, getting outside is even better  Teaching Method Utilized:  Scientific laboratory technician  Handouts given during visit include:  MyPlate for Moms  A1c  Barriers to learning/adherence to lifestyle change: none  Demonstrated degree of understanding via:  Teach Back   Monitoring/Evaluation:  Dietary intake, exercise, and body weight prn.

## 2018-03-25 NOTE — Patient Instructions (Addendum)
Remember to take your vitamin D Cranberry can be taken in the pill form Consider spreading out your meals and getting in breakfast Consider eating balanced meals and you can use the MyPlate for Healthy moms Consider walking 10 min after work, getting outside is even better

## 2018-04-08 ENCOUNTER — Ambulatory Visit (INDEPENDENT_AMBULATORY_CARE_PROVIDER_SITE_OTHER): Payer: 59 | Admitting: Obstetrics & Gynecology

## 2018-04-08 VITALS — BP 113/77 | HR 72 | Wt 295.6 lb

## 2018-04-08 DIAGNOSIS — O99210 Obesity complicating pregnancy, unspecified trimester: Secondary | ICD-10-CM

## 2018-04-08 DIAGNOSIS — O0992 Supervision of high risk pregnancy, unspecified, second trimester: Secondary | ICD-10-CM

## 2018-04-08 NOTE — Progress Notes (Signed)
PRENATAL VISIT NOTE  Subjective:  Jenna Bailey is a 27 y.o. G1P0000 at [redacted]w[redacted]d being seen today for ongoing prenatal care.  She is currently monitored for the following issues for this high-risk pregnancy and has Supervision of high-risk pregnancy; Maternal morbid obesity, antepartum (HCC); Elevated LFTs; and Nutritional counseling on their problem list.  Patient reports no complaints.  Contractions: Not present.  .  Movement: Absent. Denies leaking of fluid.   The following portions of the patient's history were reviewed and updated as appropriate: allergies, current medications, past family history, past medical history, past social history, past surgical history and problem list. Problem list updated.  Objective:   Vitals:   04/08/18 1110  BP: 113/77  Pulse: 72  Weight: 295 lb 9.6 oz (134.1 kg)    Fetal Status: Fetal Heart Rate (bpm): 152   Movement: Absent     General:  Alert, oriented and cooperative. Patient is in no acute distress.  Skin: Skin is warm and dry. No rash noted.   Cardiovascular: Normal heart rate noted  Respiratory: Normal respiratory effort, no problems with respiration noted  Abdomen: Soft, gravid, appropriate for gestational age.  Pain/Pressure: Absent     Pelvic: Cervical exam deferred        Extremities: Normal range of motion.  Edema: None  Mental Status: Normal mood and affect. Normal behavior. Normal judgment and thought content.   [place] Mfm Ob Comp Less 14 Wks  Result Date: 03/25/2018 ----------------------------------------------------------------------  OBSTETRICS REPORT                       (Signed Final 03/25/2018 01:28 pm) ---------------------------------------------------------------------- Patient Info  ID #:       [id number]                          D.O.B.:  1990-07-04 (27 yrs)  Name:       Jenna Bailey                   Visit Date: 03/25/2018 12:36 pm ---------------------------------------------------------------------- Performed By  Performed  By:     Tommi Emery         Ref. Address:     [address]                                                             [place], [place]                                                             [address]  Attending:  Noralee Space MD        Location:         [facility]  Referred By:      Tereso Newcomer MD ---------------------------------------------------------------------- Orders   #  Description                          Code         Ordered By   1  [place] MFM OB COMP LESS THAN             76801.4      [facility]      14 WEEKS  ----------------------------------------------------------------------   #  Order #                    Accession #                 Episode #   1  [id number]                  [id number]                  [id number]  ---------------------------------------------------------------------- Indications   [redacted] weeks gestation of pregnancy                Z3A.14   Encounter for antenatal screening for other    Z36.8A   genetic defects   Maternal morbid obesity                        O99.210 E66.01  ---------------------------------------------------------------------- Fetal Evaluation  Num Of Fetuses:         1  Preg. Location:         Intrauterine  Gest. Sac:              Intrauterine  Yolk Sac:               Not visualized  Fetal Pole:             Visualized  Fetal Heart Rate(bpm):  132  Cardiac Activity:       Observed  Amniotic Fluid  AFI FV:      Within normal limits                              Largest Pocket(cm)                              3.13 ---------------------------------------------------------------------- Biometry  CRL:      82.4  mm     G. Age:  13w 5d                  EDD:   09/25/18  NT:       1.88  mm ---------------------------------------------------------------------- OB History  Gravidity:    1         Term:   0        Prem:   0        SAB:   0  TOP:           0       Ectopic:  0        Living: 0 ---------------------------------------------------------------------- Gestational Age  LMP:  14w 0d        Date:  12/17/17                 EDD:   09/23/18  Best:          14w 0d     Det. By:  LMP  (12/17/17)          EDD:   09/23/18 ---------------------------------------------------------------------- Cervix Uterus Adnexa  Cervix  Normal appearance by transvaginal scan  Uterus  No abnormality visualized.  Left Ovary  Not visualized. No adnexal mass visualized.  Right Ovary  Not visualized. No adnexal mass visualized.  Cul De Sac  No free fluid seen.  Adnexa  No abnormality visualized. ---------------------------------------------------------------------- Impression  Patient is here for first-trimester screening.  On ultrasound, the CRL measurement is consistent with her  previously-established dates and good fetal heart activity is  seen. The nuchal translucency (NT) could not be measured  because of fetal position.  Fetal anatomy that could be  ascertained at this gestational age is normal.  I reassured the couple of, otherwise, normal-looking fetus.  I discussed the option of second-trimester serum screening  (quad). Patient will discuss with you.  She has an appointment for fetal anatomy scan at 20 weeks. ---------------------------------------------------------------------- Recommendations  Fetal anatomy scan at 20 weeks (05/03/18). ----------------------------------------------------------------------                  Noralee Space, MD Electronically Signed Final Report   03/25/2018 01:28 pm ----------------------------------------------------------------------   Assessment and Plan:  Pregnancy: G1P0000 at [redacted]w[redacted]d  1. Maternal morbid obesity, antepartum (HCC) Had Nutritional counseling, doing well.  2. Supervision of high risk pregnancy in second trimester NT was unable to be measured, quad screen recommended. Will be done today. Anatomy scan already  scheduled. - AFP TETRA No other complaints or concerns.  Routine obstetric precautions reviewed.  Please refer to After Visit Summary for other counseling recommendations.  Return in about 4 weeks (around 05/06/2018) for OB Visit.  Future Appointments  Date Time Provider Department Center  05/03/2018 11:15 AM [facility] [place] 4 [facility]US [facility]-US    Jaynie Collins, MD

## 2018-04-08 NOTE — Patient Instructions (Signed)
Return to clinic for any scheduled appointments or obstetric concerns, or go to [facility]U for evaluation  Second Trimester of Pregnancy The second trimester is from week 13 through week 28, month 4 through 6. This is often the time in pregnancy that you feel your best. Often times, morning sickness has lessened or quit. You may have more energy, and you may get hungry more often. Your unborn baby (fetus) is growing rapidly. At the end of the sixth month, he or she is about 9 inches long and weighs about 1 pounds. You will likely feel the baby move (quickening) between 18 and 20 weeks of pregnancy.  Research childbirth classes and hospital preregistration at [email or web address]  Follow these instructions at home:  Avoid all smoking, herbs, and alcohol. Avoid drugs not approved by your doctor.  Do not use any tobacco products, including cigarettes, chewing tobacco, and electronic cigarettes. If you need help quitting, ask your doctor. You may get counseling or other support to help you quit.  Only take medicine as told by your doctor. Some medicines are safe and some are not during pregnancy.  Exercise only as told by your doctor. Stop exercising if you start having cramps.  Eat regular, healthy meals.  Wear a good support bra if your breasts are tender.  Do not use hot tubs, steam rooms, or saunas.  Wear your seat belt when driving.  Avoid raw meat, uncooked cheese, and liter boxes and soil used by cats.  Take your prenatal vitamins.  Take 1500-2000 milligrams of calcium daily starting at the 20th week of pregnancy until you deliver your baby.  Try taking medicine that helps you poop (stool softener) as needed, and if your doctor approves. Eat more fiber by eating fresh fruit, vegetables, and whole grains. Drink enough fluids to keep your pee (urine) clear or pale yellow.  Take warm water baths (sitz baths) to soothe pain or discomfort caused by hemorrhoids. Use hemorrhoid cream if your  doctor approves.  If you have puffy, bulging veins (varicose veins), wear support hose. Raise (elevate) your feet for 15 minutes, 3-4 times a day. Limit salt in your diet.  Avoid heavy lifting, wear low heals, and sit up straight.  Rest with your legs raised if you have leg cramps or low back pain.  Visit your dentist if you have not gone during your pregnancy. Use a soft toothbrush to brush your teeth. Be gentle when you floss.  You can have sex (intercourse) unless your doctor tells you not to.  Go to your doctor visits.  Get help if:  You feel dizzy.  You have mild cramps or pressure in your lower belly (abdomen).  You have a nagging pain in your belly area.  You continue to feel sick to your stomach (nauseous), throw up (vomit), or have watery poop (diarrhea).  You have bad smelling fluid coming from your vagina.  You have pain with peeing (urination). Get help right away if:  You have a fever.  You are leaking fluid from your vagina.  You have spotting or bleeding from your vagina.  You have severe belly cramping or pain.  You lose or gain weight rapidly.  You have trouble catching your breath and have chest pain.  You notice sudden or extreme puffiness (swelling) of your face, hands, ankles, feet, or legs.  You have not felt the baby move in over an hour.  You have severe headaches that do not go away with medicine.  You have vision changes.   This information is not intended to replace advice given to you by your health care provider. Make sure you discuss any questions you have with your health care provider. Document Released: 08/20/2009 Document Revised: 11/01/2015 Document Reviewed: 07/27/2012 Elsevier Interactive Patient Education  2017 Elsevier Inc.    

## 2018-04-10 LAB — AFP TETRA
DIA Mom Value: 1.4
DIA Value (EIA): 164.52 pg/mL
DSR (BY AGE) 1 IN: 781
DSR (Second Trimester) 1 IN: 951
GESTATIONAL AGE AFP: 16 wk
MSAFP MOM: 1.04
MSAFP: 23.4 ng/mL
MSHCG MOM: 1.14
MSHCG: 32314 m[IU]/mL
Maternal Age At EDD: 29 yr
Osb Risk: 10000
Test Results:: NEGATIVE
UE3 MOM: 0.67
UE3 VALUE: 0.51 ng/mL
Weight: 295 [lb_av]

## 2018-04-14 LAB — INFORMED CONSENT NEEDED

## 2018-05-03 ENCOUNTER — Ambulatory Visit (HOSPITAL_COMMUNITY)
Admission: RE | Admit: 2018-05-03 | Discharge: 2018-05-03 | Disposition: A | Discharge status: Home / Self Care | Payer: 59 | Source: Ambulatory Visit | Attending: Obstetrics & Gynecology | Admitting: Obstetrics & Gynecology

## 2018-05-03 ENCOUNTER — Other Ambulatory Visit (HOSPITAL_COMMUNITY): Payer: Self-pay | Admitting: *Deleted

## 2018-05-03 ENCOUNTER — Encounter (HOSPITAL_COMMUNITY): Payer: Self-pay

## 2018-05-03 DIAGNOSIS — Z363 Encounter for antenatal screening for malformations: Secondary | ICD-10-CM

## 2018-05-03 DIAGNOSIS — O99212 Obesity complicating pregnancy, second trimester: Secondary | ICD-10-CM

## 2018-05-03 DIAGNOSIS — O99210 Obesity complicating pregnancy, unspecified trimester: Secondary | ICD-10-CM

## 2018-05-03 DIAGNOSIS — Z3A19 19 weeks gestation of pregnancy: Secondary | ICD-10-CM | POA: Diagnosis not present

## 2018-05-03 DIAGNOSIS — Z362 Encounter for other antenatal screening follow-up: Secondary | ICD-10-CM

## 2018-05-13 ENCOUNTER — Encounter: Payer: 59 | Admitting: Obstetrics & Gynecology

## 2018-05-13 ENCOUNTER — Ambulatory Visit (INDEPENDENT_AMBULATORY_CARE_PROVIDER_SITE_OTHER): Payer: 59 | Admitting: Obstetrics & Gynecology

## 2018-05-13 VITALS — BP 124/84 | HR 111 | Wt 293.0 lb

## 2018-05-13 DIAGNOSIS — O99212 Obesity complicating pregnancy, second trimester: Secondary | ICD-10-CM

## 2018-05-13 DIAGNOSIS — R21 Rash and other nonspecific skin eruption: Secondary | ICD-10-CM

## 2018-05-13 DIAGNOSIS — IMO0002 Reserved for concepts with insufficient information to code with codable children: Secondary | ICD-10-CM

## 2018-05-13 DIAGNOSIS — O99210 Obesity complicating pregnancy, unspecified trimester: Principal | ICD-10-CM

## 2018-05-13 DIAGNOSIS — Z0489 Encounter for examination and observation for other specified reasons: Secondary | ICD-10-CM

## 2018-05-13 DIAGNOSIS — Z3A21 21 weeks gestation of pregnancy: Secondary | ICD-10-CM

## 2018-05-13 DIAGNOSIS — O0992 Supervision of high risk pregnancy, unspecified, second trimester: Secondary | ICD-10-CM

## 2018-05-13 MED ORDER — TRIAMCINOLONE ACETONIDE 0.5 % EX CREA: 1.0000 "application " | TOPICAL_CREAM | Freq: Two times a day (BID) | CUTANEOUS | 0 refills | Status: DC

## 2018-05-13 NOTE — Patient Instructions (Signed)
Return to clinic for any scheduled appointments or obstetric concerns, or go to [facility]U for evaluation  

## 2018-05-13 NOTE — Progress Notes (Signed)
   PRENATAL VISIT NOTE  Subjective:  Jenna Bailey is a 27 y.o. G1P0000 at 6117w0d being seen today for ongoing prenatal care.  She is currently monitored for the following issues for this high-risk pregnancy and has Supervision of high-risk pregnancy; Maternal morbid obesity, antepartum (HCC); Elevated LFTs; and Nutritional counseling on their problem list.  Patient reports rash/bumps in groin area that is irritating. Wonders if she is allergic to ultrasound gel, this started after last scan. Just in folds under belly and in between thighs. Used Desitin cream which helps a little.  No other family members have similar symptoms.    Contractions: Not present. Vag. Bleeding: None.  Movement: Present. Denies leaking of fluid.   The following portions of the patient's history were reviewed and updated as appropriate: allergies, current medications, past family history, past medical history, past social history, past surgical history and problem list. Problem list updated.  Objective:   Vitals:   05/13/18 1406  BP: 124/84  Pulse: (!) 111  Weight: 293 lb (132.9 kg)    Fetal Status: Fetal Heart Rate (bpm): 151   Movement: Present     General:  Alert, oriented and cooperative. Patient is in no acute distress.  Skin: Skin is warm and dry. Erythematous maculopapular rash noted in fold under belly, and in junctions between mons pubis and upper inner thighs. Nontender, No drainage.   Cardiovascular: Normal heart rate noted  Respiratory: Normal respiratory effort, no problems with respiration noted  Abdomen: Soft, gravid, appropriate for gestational age.  Pain/Pressure: Present     Pelvic: Cervical exam deferred        Extremities: Normal range of motion.  Edema: None  Mental Status: Normal mood and affect. Normal behavior. Normal judgment and thought content.   Assessment and Plan:  Pregnancy: G1P0000 at 5617w0d  1. Acute maculopapular rash Unsure of etiology, contact dermatitis? Will do trial of  steroid cream. - triamcinolone cream (KENALOG) 0.5 %; Apply 1 application topically 2 (two) times daily. As needed  Dispense: 454 g; Refill: 0  2. Maternal morbid obesity, antepartum (HCC) -19 lb weight loss, reports reduced appetite. Nutritional supplements recommended. Will continue to monitor.  3. Evaluate anatomy not seen on prior sonogram Already scheduled follow up scan.  4. Supervision of high risk pregnancy in second trimester Preterm labor symptoms and general obstetric precautions including but not limited to vaginal bleeding, contractions, leaking of fluid and fetal movement were reviewed in detail with the patient. Please refer to After Visit Summary for other counseling recommendations.  Return in about 4 weeks (around 06/10/2018) for OB Visit.  Future Appointments  Date Time Provider Department Center  05/31/2018  2:00 PM [facility] US 3 [facility]-M[facility]US [facility]-US  06/10/2018 11:15 AM [place] BingPickens, Charlie, MD [facility]    Jaynie CollinsUgonna Udell Mazzocco, MD

## 2018-05-31 ENCOUNTER — Encounter (HOSPITAL_COMMUNITY): Payer: Self-pay

## 2018-05-31 ENCOUNTER — Ambulatory Visit (HOSPITAL_COMMUNITY)
Admission: RE | Admit: 2018-05-31 | Discharge: 2018-05-31 | Disposition: A | Discharge status: Home / Self Care | Payer: 59 | Source: Ambulatory Visit | Attending: Obstetrics and Gynecology | Admitting: Obstetrics and Gynecology

## 2018-05-31 ENCOUNTER — Ambulatory Visit (HOSPITAL_COMMUNITY): Payer: 59

## 2018-05-31 DIAGNOSIS — R945 Abnormal results of liver function studies: Secondary | ICD-10-CM

## 2018-05-31 DIAGNOSIS — Z3A23 23 weeks gestation of pregnancy: Secondary | ICD-10-CM | POA: Insufficient documentation

## 2018-05-31 DIAGNOSIS — Z362 Encounter for other antenatal screening follow-up: Secondary | ICD-10-CM | POA: Diagnosis present

## 2018-05-31 DIAGNOSIS — R7989 Other specified abnormal findings of blood chemistry: Secondary | ICD-10-CM

## 2018-05-31 DIAGNOSIS — O99212 Obesity complicating pregnancy, second trimester: Secondary | ICD-10-CM | POA: Diagnosis not present

## 2018-06-01 ENCOUNTER — Other Ambulatory Visit (HOSPITAL_COMMUNITY): Payer: Self-pay | Admitting: *Deleted

## 2018-06-01 DIAGNOSIS — O99213 Obesity complicating pregnancy, third trimester: Secondary | ICD-10-CM

## 2018-06-09 NOTE — L&D Delivery Note (Signed)
OB/GYN Faculty Practice Delivery Note  Jenna Bailey is a 28 y.o. G1P0000 s/p VAVD at [redacted]w[redacted]d. She was admitted for augmentation of labor with PROM.   ROM: 25h 92m with light meconium-stained fluid GBS Status: negative Maximum Maternal Temperature: Temp (48hrs), Avg:98 F (36.7 C), Min:97.5 F (36.4 C), Max:98.3 F (36.8 C)  Labor Progress: . Admitted with PROM light meconium, not having contractions . Received one dose of cytotec then transitioned to pitocin . Progressed to complete  Delivery Date/Time: 09/22/18 at 0325 Delivery: Called to room and patient was complete. Remained in room with pushing given occasional fetal decelerations with pushing. Pushed effectively and FHR noted to be in 60s at outlet. Briefly discussed purpose/use of vacuum assistance with mother given fetal decelerations, she was in agreement. Kiwi vacuum applied, head delivered with one pull direct OA. No nuchal cord present. Shoulder and body delivered in usual fashion. Infant with spontaneous cry, placed on mother's abdomen, dried and stimulated. Cord clamped x 2 after 1-minute delay, and cut by father of baby. Cord blood drawn. Placenta delivered spontaneously with gentle cord traction. Fundus firm with massage and Pitocin. Labia, perineum, vagina, and cervix inspected inspected with 2nd degree perineal laceration, right labial and right periurethral lacerations.   Placenta: spontaneous, intact, 3-vessel cord (sent to pathology) Complications: infant required resuscitation at warmer for meconium suctioning  lacerations as noted  vacuum as noted  meconium stained fluid and terminal meconium at delivery Lacerations: 2nd degree perineal repaired with 3-0 Vicryl  right labial and right periurethral repaired with 4-0 Monocryl and figure-of-eight with Vicryl EBL: 901cc per Triton - attributed to labial lacerations, excellent uterine tone noted Analgesia: local lidocaine   Postpartum Planning [x]  message to sent to  schedule follow-up  [x]  vaccines UTD  Infant: Vigorous female  APGARs 8, 9  weight pending but appears AGA  Jacquel Mccamish S. Earlene Plater, DO OB/GYN Fellow, Faculty Practice

## 2018-06-10 ENCOUNTER — Ambulatory Visit (INDEPENDENT_AMBULATORY_CARE_PROVIDER_SITE_OTHER): Payer: 59 | Admitting: Obstetrics and Gynecology

## 2018-06-10 VITALS — BP 121/80 | HR 98 | Wt 292.0 lb

## 2018-06-10 DIAGNOSIS — R51 Headache: Secondary | ICD-10-CM

## 2018-06-10 DIAGNOSIS — R519 Headache, unspecified: Secondary | ICD-10-CM

## 2018-06-10 DIAGNOSIS — O26892 Other specified pregnancy related conditions, second trimester: Secondary | ICD-10-CM

## 2018-06-10 DIAGNOSIS — O99212 Obesity complicating pregnancy, second trimester: Secondary | ICD-10-CM

## 2018-06-10 DIAGNOSIS — Z6841 Body Mass Index (BMI) 40.0 and over, adult: Secondary | ICD-10-CM

## 2018-06-10 DIAGNOSIS — L089 Local infection of the skin and subcutaneous tissue, unspecified: Secondary | ICD-10-CM

## 2018-06-10 DIAGNOSIS — R945 Abnormal results of liver function studies: Secondary | ICD-10-CM

## 2018-06-10 DIAGNOSIS — O0990 Supervision of high risk pregnancy, unspecified, unspecified trimester: Secondary | ICD-10-CM

## 2018-06-10 DIAGNOSIS — O0992 Supervision of high risk pregnancy, unspecified, second trimester: Secondary | ICD-10-CM

## 2018-06-10 DIAGNOSIS — O99210 Obesity complicating pregnancy, unspecified trimester: Secondary | ICD-10-CM

## 2018-06-10 DIAGNOSIS — B372 Candidiasis of skin and nail: Secondary | ICD-10-CM

## 2018-06-10 DIAGNOSIS — R7989 Other specified abnormal findings of blood chemistry: Secondary | ICD-10-CM

## 2018-06-10 MED ORDER — NYSTATIN 100000 UNIT/GM EX POWD: Freq: Two times a day (BID) | CUTANEOUS | 0 refills | Status: AC

## 2018-06-10 MED ORDER — MAGNESIUM MALATE 1250 (141.7 MG) MG PO TABS: 1.0000 | ORAL_TABLET | Freq: Every day | ORAL | Status: DC | PRN

## 2018-06-10 NOTE — Progress Notes (Signed)
Patient reports daily frontal headaches PS 4-5/10 that has gotten worse in the last week and half. Patient states headache pain does goes from right temporal to left at times as well. Patient states she has tried OTC Tylenol ES 500 mg and Excedrine  PM with no relief.   Patient also reports rash/bumps that began on her lower abdomen has now spread to the right and left sides of her abdomen in the last 1-2 weeks. Patient states she does have some itching as well in those area.

## 2018-06-10 NOTE — Progress Notes (Signed)
Prenatal Visit Note Date: 06/10/2018 Clinic: [facility]  Subjective:  Jenna Bailey is a 28 y.o. G1P0000 at [redacted]w[redacted]d being seen today for ongoing prenatal care.  She is currently monitored for the following issues for this high-risk pregnancy and has Supervision of high-risk pregnancy; Maternal morbid obesity, antepartum (HCC); Elevated LFTs; Nutritional counseling; and BMI 45.0-49.9, adult (HCC) on their problem list.  Patient reports  HA in pregnancy. Doesn't predate pregnancy. Rare caffeine, chocolate intake. Tried OTC apap and excedrin with some relief  Skin bumps on abdomen are somewhat better on the steroid cream. No itching or pain.  Contractions: Not present. Vag. Bleeding: None.  Movement: Present. Denies leaking of fluid.   The following portions of the patient's history were reviewed and updated as appropriate: allergies, current medications, past family history, past medical history, past social history, past surgical history and problem list. Problem list updated.  Objective:   Vitals:   06/10/18 1105  BP: 121/80  Pulse: 98  Weight: 292 lb (132.5 kg)    Fetal Status: Fetal Heart Rate (bpm): 150 Fundal Height: 25 cm Movement: Present     General:  Alert, oriented and cooperative. Patient is in no acute distress.  Skin: Rash c/w yeast under belly pannus and close to inguinal folds Small, handful of red bumps on b/l lateral belly  Cardiovascular: Normal heart rate noted  Respiratory: Normal respiratory effort, no problems with respiration noted  Abdomen: Soft, gravid, appropriate for gestational age. Pain/Pressure: Absent     Pelvic:  Cervical exam deferred        Extremities: Normal range of motion.  Edema: None  Mental Status: Normal mood and affect. Normal behavior. Normal judgment and thought content.   Urinalysis:      Assessment and Plan:  Pregnancy: G1P0000 at [redacted]w[redacted]d  1. Pregnancy headache in second trimester Recommend Magnesium qday. F/u cbc  at next visit  2. Supervision of high risk pregnancy, antepartum Routine care. 28wk labs nv.  - Comprehensive metabolic panel; Future - CBC; Future - RPR; Future - HIV antibody (with reflex); Future - Glucose Tolerance, 2 Hours w/1 Hour; Future  3. BMI 45.0-49.9, adult (HCC) Weight stable  4. Intertriginous candidiasis Nystatin powder and then recommend barrier cream and keep area dry  5. Skin pustule Pt states she is breaking out more on body and bumps appear to be acne bumps. Recommend she keep area clean and dry  6. Elevated LFTs Rpt cmp nv.   7. Maternal morbid obesity, antepartum (HCC)  Preterm labor symptoms and general obstetric precautions including but not limited to vaginal bleeding, contractions, leaking of fluid and fetal movement were reviewed in detail with the patient. Please refer to After Visit Summary for other counseling recommendations.  Return in about 2 weeks (around 06/24/2018) for rob and 2hr GTT.   [place] Bing, MD

## 2018-07-06 ENCOUNTER — Other Ambulatory Visit: Payer: 59

## 2018-07-06 ENCOUNTER — Ambulatory Visit (INDEPENDENT_AMBULATORY_CARE_PROVIDER_SITE_OTHER): Payer: 59 | Admitting: Obstetrics and Gynecology

## 2018-07-06 VITALS — BP 129/85 | HR 108 | Wt 291.0 lb

## 2018-07-06 DIAGNOSIS — R7989 Other specified abnormal findings of blood chemistry: Secondary | ICD-10-CM

## 2018-07-06 DIAGNOSIS — R945 Abnormal results of liver function studies: Secondary | ICD-10-CM

## 2018-07-06 DIAGNOSIS — Z23 Encounter for immunization: Secondary | ICD-10-CM

## 2018-07-06 DIAGNOSIS — O0990 Supervision of high risk pregnancy, unspecified, unspecified trimester: Secondary | ICD-10-CM

## 2018-07-06 DIAGNOSIS — O0993 Supervision of high risk pregnancy, unspecified, third trimester: Secondary | ICD-10-CM

## 2018-07-06 DIAGNOSIS — Z6841 Body Mass Index (BMI) 40.0 and over, adult: Secondary | ICD-10-CM

## 2018-07-06 DIAGNOSIS — O09213 Supervision of pregnancy with history of pre-term labor, third trimester: Secondary | ICD-10-CM

## 2018-07-06 NOTE — Progress Notes (Signed)
Prenatal Visit Note Date: 07/06/2018 Clinic: [facility]  Subjective:  Jenna Bailey is a 28 y.o. G1P0000 at 3274w5d being seen today for ongoing prenatal care.  She is currently monitored for the following issues for this high-risk pregnancy and has Morbid obesity (HCC); Supervision of high-risk pregnancy; Maternal morbid obesity, antepartum (HCC); Elevated LFTs; Nutritional counseling; and BMI 45.0-49.9, adult (HCC) on their problem list.  Patient reports no complaints.   Contractions: Not present. Vag. Bleeding: None.  Movement: Present. Denies leaking of fluid.   The following portions of the patient's history were reviewed and updated as appropriate: allergies, current medications, past family history, past medical history, past social history, past surgical history and problem list. Problem list updated.  Objective:   Vitals:   07/06/18 0822  BP: 129/85  Pulse: (!) 108  Weight: 291 lb (132 kg)    Fetal Status: Fetal Heart Rate (bpm): 129 Fundal Height: 30 cm Movement: Present     General:  Alert, oriented and cooperative. Patient is in no acute distress.  Skin: Skin is warm and dry. No rash noted.   Cardiovascular: Normal heart rate noted  Respiratory: Normal respiratory effort, no problems with respiration noted  Abdomen: Soft, gravid, appropriate for gestational age. Pain/Pressure: Absent     Pelvic:  Cervical exam deferred        Extremities: Normal range of motion.  Edema: None  Mental Status: Normal mood and affect. Normal behavior. Normal judgment and thought content.   Urinalysis:      Assessment and Plan:  Pregnancy: G1P0000 at 4174w5d  1. Supervision of high risk pregnancy, antepartum Routine care. D/w pt re:BC nv.  2. BMI 45.0-49.9, adult (HCC) Weight good  3. Elevated LFTs cmp today  Preterm labor symptoms and general obstetric precautions including but not limited to vaginal bleeding, contractions, leaking of fluid and fetal movement  were reviewed in detail with the patient. Please refer to After Visit Summary for other counseling recommendations.  Return in about 2 weeks (around 07/20/2018) for rob.    BingPickens, Charene Mccallister, MD

## 2018-07-07 ENCOUNTER — Other Ambulatory Visit: Payer: Self-pay | Admitting: Primary Care

## 2018-07-07 LAB — CBC
Hematocrit: 34.3 % (ref 34.0–46.6)
Hemoglobin: 11.2 g/dL (ref 11.1–15.9)
MCH: 26.8 pg (ref 26.6–33.0)
MCHC: 32.7 g/dL (ref 31.5–35.7)
MCV: 82 fL (ref 79–97)
Platelets: 342 10*3/uL (ref 150–450)
RBC: 4.18 x10E6/uL (ref 3.77–5.28)
RDW: 13.7 % (ref 11.7–15.4)
WBC: 8.8 10*3/uL (ref 3.4–10.8)

## 2018-07-07 LAB — COMPREHENSIVE METABOLIC PANEL
ALT: 15 IU/L (ref 0–32)
AST: 16 IU/L (ref 0–40)
Albumin/Globulin Ratio: 1.2 (ref 1.2–2.2)
Albumin: 3.4 g/dL — ABNORMAL LOW (ref 3.9–5.0)
Alkaline Phosphatase: 94 IU/L (ref 39–117)
BILIRUBIN TOTAL: 0.2 mg/dL (ref 0.0–1.2)
BUN/Creatinine Ratio: 12 (ref 9–23)
BUN: 5 mg/dL — ABNORMAL LOW (ref 6–20)
CO2: 21 mmol/L (ref 20–29)
Calcium: 9.1 mg/dL (ref 8.7–10.2)
Chloride: 102 mmol/L (ref 96–106)
Creatinine, Ser: 0.43 mg/dL — ABNORMAL LOW (ref 0.57–1.00)
GFR calc Af Amer: 160 mL/min/{1.73_m2} (ref >59)
GFR calc non Af Amer: 139 mL/min/{1.73_m2} (ref >59)
Globulin, Total: 2.8 g/dL (ref 1.5–4.5)
Glucose: 82 mg/dL (ref 65–99)
POTASSIUM: 4.5 mmol/L (ref 3.5–5.2)
SODIUM: 140 mmol/L (ref 134–144)
Total Protein: 6.2 g/dL (ref 6.0–8.5)

## 2018-07-07 LAB — GLUCOSE TOLERANCE, 2 HOURS W/ 1HR
GLUCOSE, FASTING: 77 mg/dL (ref 65–91)
Glucose, 1 hour: 147 mg/dL (ref 65–179)
Glucose, 2 hour: 121 mg/dL (ref 65–152)

## 2018-07-07 LAB — RPR: RPR Ser Ql: NONREACTIVE

## 2018-07-07 LAB — HIV ANTIBODY (ROUTINE TESTING W REFLEX): HIV Screen 4th Generation wRfx: NONREACTIVE

## 2018-07-08 ENCOUNTER — Encounter: Payer: 59 | Admitting: Primary Care

## 2018-07-15 ENCOUNTER — Other Ambulatory Visit (HOSPITAL_COMMUNITY): Payer: Self-pay | Admitting: *Deleted

## 2018-07-15 ENCOUNTER — Other Ambulatory Visit: Payer: Self-pay | Admitting: Primary Care

## 2018-07-15 ENCOUNTER — Encounter (HOSPITAL_COMMUNITY): Payer: Self-pay

## 2018-07-15 ENCOUNTER — Ambulatory Visit (HOSPITAL_COMMUNITY)
Admission: RE | Admit: 2018-07-15 | Discharge: 2018-07-15 | Disposition: A | Discharge status: Home / Self Care | Payer: 59 | Source: Ambulatory Visit | Attending: Obstetrics and Gynecology | Admitting: Obstetrics and Gynecology

## 2018-07-15 DIAGNOSIS — O99210 Obesity complicating pregnancy, unspecified trimester: Secondary | ICD-10-CM

## 2018-07-15 DIAGNOSIS — Z362 Encounter for other antenatal screening follow-up: Secondary | ICD-10-CM | POA: Diagnosis not present

## 2018-07-15 DIAGNOSIS — O99213 Obesity complicating pregnancy, third trimester: Secondary | ICD-10-CM | POA: Insufficient documentation

## 2018-07-15 DIAGNOSIS — Z0000 Encounter for general adult medical examination without abnormal findings: Secondary | ICD-10-CM

## 2018-07-15 DIAGNOSIS — Z6841 Body Mass Index (BMI) 40.0 and over, adult: Secondary | ICD-10-CM

## 2018-07-15 DIAGNOSIS — Z3A28 28 weeks gestation of pregnancy: Secondary | ICD-10-CM | POA: Diagnosis not present

## 2018-07-20 ENCOUNTER — Encounter: Payer: 59 | Admitting: Obstetrics and Gynecology

## 2018-07-22 ENCOUNTER — Ambulatory Visit (INDEPENDENT_AMBULATORY_CARE_PROVIDER_SITE_OTHER): Payer: 59 | Admitting: Obstetrics and Gynecology

## 2018-07-22 ENCOUNTER — Other Ambulatory Visit (INDEPENDENT_AMBULATORY_CARE_PROVIDER_SITE_OTHER): Payer: 59

## 2018-07-22 VITALS — BP 122/82 | HR 93 | Wt 295.0 lb

## 2018-07-22 DIAGNOSIS — O0993 Supervision of high risk pregnancy, unspecified, third trimester: Secondary | ICD-10-CM

## 2018-07-22 DIAGNOSIS — O99210 Obesity complicating pregnancy, unspecified trimester: Secondary | ICD-10-CM

## 2018-07-22 DIAGNOSIS — Z0000 Encounter for general adult medical examination without abnormal findings: Secondary | ICD-10-CM

## 2018-07-22 DIAGNOSIS — Z6841 Body Mass Index (BMI) 40.0 and over, adult: Secondary | ICD-10-CM

## 2018-07-22 DIAGNOSIS — Z3A31 31 weeks gestation of pregnancy: Secondary | ICD-10-CM

## 2018-07-22 LAB — LIPID PANEL
CHOL/HDL RATIO: 4
Cholesterol: 170 mg/dL (ref 0–200)
HDL: 42.6 mg/dL (ref >39.00)
Triglycerides: 415 mg/dL — ABNORMAL HIGH (ref 0.0–149.0)

## 2018-07-22 LAB — LDL CHOLESTEROL, DIRECT: Direct LDL: 91 mg/dL

## 2018-07-22 LAB — HEMOGLOBIN A1C: Hgb A1c MFr Bld: 5.3 % (ref 4.6–6.5)

## 2018-07-22 NOTE — Progress Notes (Signed)
Prenatal Visit Note Date: 07/22/2018 Clinic: [facility]  Subjective:  Jenna MaidensKatie K Pennebaker is a 28 y.o. G1P0000 at 6268w0d being seen today for ongoing prenatal care.  She is currently monitored for the following issues for this high-risk pregnancy and has Morbid obesity (HCC); Supervision of high-risk pregnancy; Maternal morbid obesity, antepartum (HCC); Nutritional counseling; and BMI 45.0-49.9, adult (HCC) on their problem list.  Patient reports no complaints.   Contractions: Not present. Vag. Bleeding: None.  Movement: Present. Denies leaking of fluid.   The following portions of the patient's history were reviewed and updated as appropriate: allergies, current medications, past family history, past medical history, past social history, past surgical history and problem list. Problem list updated.  Objective:   Vitals:   07/22/18 1326  BP: 122/82  Pulse: 93  Weight: 295 lb (133.8 kg)    Fetal Status: Fetal Heart Rate (bpm): 145 Fundal Height: 32 cm Movement: Present     General:  Alert, oriented and cooperative. Patient is in no acute distress.  Skin: Skin is warm and dry. No rash noted.   Cardiovascular: Normal heart rate noted  Respiratory: Normal respiratory effort, no problems with respiration noted  Abdomen: Soft, gravid, appropriate for gestational age. Pain/Pressure: Absent     Pelvic:  Cervical exam deferred        Extremities: Normal range of motion.  Edema: None  Mental Status: Normal mood and affect. Normal behavior. Normal judgment and thought content.   Urinalysis:      Assessment and Plan:  Pregnancy: G1P0000 at 7368w0d  Routine care. Weight stable  Preterm labor symptoms and general obstetric precautions including but not limited to vaginal bleeding, contractions, leaking of fluid and fetal movement were reviewed in detail with the patient. Please refer to After Visit Summary for other counseling recommendations.  Return in about 2 weeks (around  08/05/2018) for rob.   [place] BingPickens, Adryan Druckenmiller, MD

## 2018-07-26 ENCOUNTER — Ambulatory Visit (INDEPENDENT_AMBULATORY_CARE_PROVIDER_SITE_OTHER): Payer: 59 | Admitting: Primary Care

## 2018-07-26 ENCOUNTER — Encounter: Payer: Self-pay | Admitting: Primary Care

## 2018-07-26 VITALS — BP 120/78 | HR 118 | Temp 98.2°F | Ht 66.5 in | Wt 296.0 lb

## 2018-07-26 DIAGNOSIS — R51 Headache: Secondary | ICD-10-CM

## 2018-07-26 DIAGNOSIS — K219 Gastro-esophageal reflux disease without esophagitis: Secondary | ICD-10-CM

## 2018-07-26 DIAGNOSIS — E781 Pure hyperglyceridemia: Secondary | ICD-10-CM | POA: Diagnosis not present

## 2018-07-26 DIAGNOSIS — Z0000 Encounter for general adult medical examination without abnormal findings: Secondary | ICD-10-CM

## 2018-07-26 DIAGNOSIS — R519 Headache, unspecified: Secondary | ICD-10-CM

## 2018-07-26 MED ORDER — OMEPRAZOLE 40 MG PO CPDR: 40.0000 mg | DELAYED_RELEASE_CAPSULE | Freq: Every day | ORAL | 3 refills | Status: DC

## 2018-07-26 NOTE — Assessment & Plan Note (Signed)
Discussed the importance of a healthy diet and regular exercise in order for weight loss, and to reduce the risk of any potential medical problems.  

## 2018-07-26 NOTE — Progress Notes (Signed)
Subjective:    Patient ID: Jenna Bailey, female    DOB: 10/22/1990, 28 y.o.   MRN: [id number]  HPI  Ms. Swiggum is a 28 year old female who presents today for complete physical.  Immunizations: -Tetanus: Completed in 2020 -Influenza: Completed this season    Diet: She endorses a healthy diet Breakfast: Toast/bagel with peanut butter Lunch: Frozen meal, fast food, restaurants  Dinner: Meat, starch, vegetable/beans Snacks: Sandwich  Desserts: 1-2 times weekly  Beverages: Water, soda  Exercise: She is walking three times weekly Eye exam: Completed in 2019 Dental exam: Completes semi-annually  Pap Smear: Completed in 2019   Review of Systems  Constitutional: Negative for unexpected weight change.  HENT: Negative for rhinorrhea.   Respiratory: Negative for cough and shortness of breath.   Cardiovascular: Negative for chest pain.  Gastrointestinal: Negative for constipation and diarrhea.  Genitourinary: Negative for difficulty urinating.  Musculoskeletal: Negative for arthralgias and myalgias.  Skin: Negative for rash.  Allergic/Immunologic: Negative for environmental allergies.  Neurological: Negative for dizziness, numbness and headaches.  Psychiatric/Behavioral: The patient is not nervous/anxious.    BP Readings from Last 3 Encounters:  07/26/18 120/78  07/22/18 122/82  07/15/18 119/72        Past Medical History:  Diagnosis Date  . Abnormal uterine bleeding 08/25/2016  . Chicken pox   . GERD (gastroesophageal reflux disease)   . Hypertriglyceridemia 03/07/2015  . Morbid obesity (HCC) 06/22/2013  . Recurrent boils 07/26/2014  . Scabies 02/11/2018  . Vitamin D deficiency      Social History   Socioeconomic History  . Marital status: Married    Spouse name: Not on file  . Number of children: Not on file  . Years of education: Not on file  . Highest education level: Not on file  Occupational History  . Not on file  Social Needs  . Financial resource strain:  Not on file  . Food insecurity:    Worry: Not on file    Inability: Not on file  . Transportation needs:    Medical: Not on file    Non-medical: Not on file  Tobacco Use  . Smoking status: Never Smoker  . Smokeless tobacco: Never Used  Substance and Sexual Activity  . Alcohol use: No  . Drug use: No  . Sexual activity: Yes    Birth control/protection: None  Lifestyle  . Physical activity:    Days per week: Not on file    Minutes per session: Not on file  . Stress: Not on file  Relationships  . Social connections:    Talks on phone: Not on file    Gets together: Not on file    Attends religious service: Not on file    Active member of club or organization: Not on file    Attends meetings of clubs or organizations: Not on file    Relationship status: Not on file  . Intimate partner violence:    Fear of current or ex partner: Not on file    Emotionally abused: Not on file    Physically abused: Not on file    Forced sexual activity: Not on file  Other Topics Concern  . Not on file  Social History Narrative   Married.   No children, undergoing fertility treatments.   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   Enjoys reading, traveling, watching TV.    Past Surgical History:  Procedure Laterality Date  . WISDOM TOOTH EXTRACTION  Family History  Problem Relation Age of Onset  . Diabetes Paternal Grandmother   . Depression Father   . Alcohol abuse Father   . Hypertension Mother     No Known Allergies  Current Outpatient Medications on File Prior to Visit  Medication Sig Dispense Refill  . aspirin EC 81 MG tablet Take 1 tablet (81 mg total) by mouth daily. Take after 12 weeks for prevention of preeclampsia later in pregnancy 300 tablet 2  . Magnesium Malate 1250 (141.7 Mg) MG TABS Take 1 tablet by mouth daily as needed (headache). 60 tablet   . Prenatal Vit-Fe Fumarate-FA (PRENATAL VITAMIN PO) Take by mouth daily.     No current facility-administered  medications on file prior to visit.     BP 120/78   Pulse (!) 118   Temp 98.2 F (36.8 C) (Oral)   Ht 5' 6.5" (1.689 m)   Wt 296 lb (134.3 kg)   LMP 12/17/2017 (Exact Date)   SpO2 98%   BMI 47.06 kg/m    Objective:   Physical Exam  Constitutional: She is oriented to person, place, and time. She appears well-nourished.  HENT:  Mouth/Throat: No oropharyngeal exudate.  Eyes: Pupils are equal, round, and reactive to light. EOM are normal.  Neck: Neck supple. No thyromegaly present.  Cardiovascular: Normal rate and regular rhythm.  Respiratory: Effort normal and breath sounds normal.  GI: Soft. Bowel sounds are normal. There is no abdominal tenderness.  Musculoskeletal: Normal range of motion.  Neurological: She is alert and oriented to person, place, and time.  Skin: Skin is warm and dry.  Psychiatric: She has a normal mood and affect.           Assessment & Plan:

## 2018-07-26 NOTE — Assessment & Plan Note (Signed)
Trigs of 415 on recent labs, is currently [redacted] weeks pregnant. Encouraged healthy diet, regular exercise, repeat in 6 months.

## 2018-07-26 NOTE — Patient Instructions (Addendum)
Continue exercising. You should be getting 150 minutes of exercise weekly.  Continue to work on a healthy diet, decrease fast/fried/fatty foods.   Schedule a lab only appointment for 6 months to repeat cholesterol.   We will see you in one year or sooner if needed.  It was a pleasure to see you today!   Preventive Care 18-39 Years, Female Preventive care refers to lifestyle choices and visits with your health care provider that can promote health and wellness. What does preventive care include?   A yearly physical exam. This is also called an annual well check.  Dental exams once or twice a year.  Routine eye exams. Ask your health care provider how often you should have your eyes checked.  Personal lifestyle choices, including: ? Daily care of your teeth and gums. ? Regular physical activity. ? Eating a healthy diet. ? Avoiding tobacco and drug use. ? Limiting alcohol use. ? Practicing safe sex. ? Taking vitamin and mineral supplements as recommended by your health care provider. What happens during an annual well check? The services and screenings done by your health care provider during your annual well check will depend on your age, overall health, lifestyle risk factors, and family history of disease. Counseling Your health care provider may ask you questions about your:  Alcohol use.  Tobacco use.  Drug use.  Emotional well-being.  Home and relationship well-being.  Sexual activity.  Eating habits.  Work and work Statistician.  Method of birth control.  Menstrual cycle.  Pregnancy history. Screening You may have the following tests or measurements:  Height, weight, and BMI.  Diabetes screening. This is done by checking your blood sugar (glucose) after you have not eaten for a while (fasting).  Blood pressure.  Lipid and cholesterol levels. These may be checked every 5 years starting at age 55.  Skin check.  Hepatitis C blood test.  Hepatitis  B blood test.  Sexually transmitted disease (STD) testing.  BRCA-related cancer screening. This may be done if you have a family history of breast, ovarian, tubal, or peritoneal cancers.  Pelvic exam and Pap test. This may be done every 3 years starting at age 66. Starting at age 63, this may be done every 5 years if you have a Pap test in combination with an HPV test. Discuss your test results, treatment options, and if necessary, the need for more tests with your health care provider. Vaccines Your health care provider may recommend certain vaccines, such as:  Influenza vaccine. This is recommended every year.  Tetanus, diphtheria, and acellular pertussis (Tdap, Td) vaccine. You may need a Td booster every 10 years.  Varicella vaccine. You may need this if you have not been vaccinated.  HPV vaccine. If you are 62 or younger, you may need three doses over 6 months.  Measles, mumps, and rubella (MMR) vaccine. You may need at least one dose of MMR. You may also need a second dose.  Pneumococcal 13-valent conjugate (PCV13) vaccine. You may need this if you have certain conditions and were not previously vaccinated.  Pneumococcal polysaccharide (PPSV23) vaccine. You may need one or two doses if you smoke cigarettes or if you have certain conditions.  Meningococcal vaccine. One dose is recommended if you are age 42-21 years and a first-year college student living in a residence hall, or if you have one of several medical conditions. You may also need additional booster doses.  Hepatitis A vaccine. You may need this if you have certain  conditions or if you travel or work in places where you may be exposed to hepatitis A.  Hepatitis B vaccine. You may need this if you have certain conditions or if you travel or work in places where you may be exposed to hepatitis B.  Haemophilus influenzae type b (Hib) vaccine. You may need this if you have certain risk factors. Talk to your health care  provider about which screenings and vaccines you need and how often you need them. This information is not intended to replace advice given to you by your health care provider. Make sure you discuss any questions you have with your health care provider. Document Released: 07/22/2001 Document Revised: 01/06/2017 Document Reviewed: 03/27/2015 Elsevier Interactive Patient Education  2019 [employer].

## 2018-07-26 NOTE — Assessment & Plan Note (Signed)
Immunizations UTD. Pap smear UTD. Discussed the importance of a healthy diet and regular exercise in order for weight loss, and to reduce the risk of any potential medical problems. Exam unremarkable. Labs reviewed. Follow up in 1 year for CPE.

## 2018-07-26 NOTE — Assessment & Plan Note (Signed)
Doing well on Magnesium Malate for headaches, continue same.

## 2018-07-26 NOTE — Assessment & Plan Note (Signed)
Doing well on omeprazole 40 mg, continue same.  

## 2018-08-02 ENCOUNTER — Ambulatory Visit (INDEPENDENT_AMBULATORY_CARE_PROVIDER_SITE_OTHER): Payer: 59 | Admitting: Obstetrics and Gynecology

## 2018-08-02 VITALS — BP 130/82 | HR 101 | Wt 293.0 lb

## 2018-08-02 DIAGNOSIS — O99213 Obesity complicating pregnancy, third trimester: Secondary | ICD-10-CM

## 2018-08-02 DIAGNOSIS — Z3A32 32 weeks gestation of pregnancy: Secondary | ICD-10-CM

## 2018-08-02 DIAGNOSIS — Z6841 Body Mass Index (BMI) 40.0 and over, adult: Secondary | ICD-10-CM

## 2018-08-02 DIAGNOSIS — O0993 Supervision of high risk pregnancy, unspecified, third trimester: Secondary | ICD-10-CM

## 2018-08-02 DIAGNOSIS — O99210 Obesity complicating pregnancy, unspecified trimester: Secondary | ICD-10-CM

## 2018-08-02 NOTE — Progress Notes (Signed)
Prenatal Visit Note Date: 08/02/2018 Clinic: [facility]  Subjective:  Jenna Bailey is a 28 y.o. G1P0000 at [redacted]w[redacted]d being seen today for ongoing prenatal care.  She is currently monitored for the following issues for this low-risk pregnancy and has GERD (gastroesophageal reflux disease); Morbid obesity (HCC); Frequent headaches; Hypertriglyceridemia; Preventative health care; Supervision of high-risk pregnancy; Maternal morbid obesity, antepartum (HCC); Nutritional counseling; and BMI 45.0-49.9, adult (HCC) on their problem list.  Patient reports no complaints.   Contractions: Not present. Vag. Bleeding: None.  Movement: Present. Denies leaking of fluid.   The following portions of the patient's history were reviewed and updated as appropriate: allergies, current medications, past family history, past medical history, past social history, past surgical history and problem list. Problem list updated.  Objective:   Vitals:   08/02/18 1309  BP: 130/82  Pulse: (!) 101  Weight: 293 lb (132.9 kg)    Fetal Status: Fetal Heart Rate (bpm): 144 Fundal Height: 34 cm Movement: Present     General:  Alert, oriented and cooperative. Patient is in no acute distress.  Skin: Skin is warm and dry. No rash noted.   Cardiovascular: Normal heart rate noted  Respiratory: Normal respiratory effort, no problems with respiration noted  Abdomen: Soft, gravid, appropriate for gestational age. Pain/Pressure: Present     Pelvic:  Cervical exam deferred        Extremities: Normal range of motion.  Edema: None  Mental Status: Normal mood and affect. Normal behavior. Normal judgment and thought content.   Urinalysis:      Assessment and Plan:  Pregnancy: G1P0000 at [redacted]w[redacted]d  1. Supervision of high risk pregnancy in third trimester Routine care. Ask re: BC, br vs formula feeding nv  2. BMI 45.0-49.9, adult (HCC) Weight stable  3. Headaches None. Well controlled on the qday  magnesium  Preterm labor symptoms and general obstetric precautions including but not limited to vaginal bleeding, contractions, leaking of fluid and fetal movement were reviewed in detail with the patient. Please refer to After Visit Summary for other counseling recommendations.  Return in about 2 weeks (around 08/16/2018) for rob.   [place] Bing, MD

## 2018-08-12 ENCOUNTER — Ambulatory Visit (HOSPITAL_COMMUNITY)
Admission: RE | Admit: 2018-08-12 | Discharge: 2018-08-12 | Disposition: A | Discharge status: Home / Self Care | Payer: 59 | Source: Ambulatory Visit | Attending: Obstetrics and Gynecology | Admitting: Obstetrics and Gynecology

## 2018-08-12 ENCOUNTER — Other Ambulatory Visit (HOSPITAL_COMMUNITY): Payer: Self-pay | Admitting: *Deleted

## 2018-08-12 ENCOUNTER — Ambulatory Visit (HOSPITAL_COMMUNITY): Payer: 59 | Admitting: *Deleted

## 2018-08-12 ENCOUNTER — Encounter (HOSPITAL_COMMUNITY): Payer: Self-pay

## 2018-08-12 VITALS — BP 116/80 | HR 115 | Wt 294.2 lb

## 2018-08-12 DIAGNOSIS — Z362 Encounter for other antenatal screening follow-up: Secondary | ICD-10-CM | POA: Diagnosis not present

## 2018-08-12 DIAGNOSIS — O99210 Obesity complicating pregnancy, unspecified trimester: Secondary | ICD-10-CM

## 2018-08-12 DIAGNOSIS — Z3A34 34 weeks gestation of pregnancy: Secondary | ICD-10-CM | POA: Diagnosis not present

## 2018-08-16 ENCOUNTER — Ambulatory Visit (INDEPENDENT_AMBULATORY_CARE_PROVIDER_SITE_OTHER): Payer: 59 | Admitting: Obstetrics and Gynecology

## 2018-08-16 VITALS — BP 130/82 | HR 86 | Wt 296.0 lb

## 2018-08-16 DIAGNOSIS — O99213 Obesity complicating pregnancy, third trimester: Secondary | ICD-10-CM

## 2018-08-16 DIAGNOSIS — Z3A34 34 weeks gestation of pregnancy: Secondary | ICD-10-CM

## 2018-08-16 DIAGNOSIS — O0993 Supervision of high risk pregnancy, unspecified, third trimester: Secondary | ICD-10-CM

## 2018-08-16 DIAGNOSIS — Z6841 Body Mass Index (BMI) 40.0 and over, adult: Secondary | ICD-10-CM

## 2018-08-16 NOTE — Progress Notes (Signed)
Prenatal Visit Note Date: 08/16/2018 Clinic: [facility]  Subjective:  Jenna Bailey is a 28 y.o. G1P0000 at [redacted]w[redacted]d being seen today for ongoing prenatal care.  She is currently monitored for the following issues for this low-risk pregnancy and has GERD (gastroesophageal reflux disease); Morbid obesity (HCC); Frequent headaches; Hypertriglyceridemia; Preventative health care; Supervision of high-risk pregnancy; Maternal morbid obesity, antepartum (HCC); Nutritional counseling; and BMI 45.0-49.9, adult (HCC) on their problem list.  Patient reports no complaints.   Contractions: Not present. Vag. Bleeding: None.  Movement: Present. Denies leaking of fluid.   The following portions of the patient's history were reviewed and updated as appropriate: allergies, current medications, past family history, past medical history, past social history, past surgical history and problem list. Problem list updated.  Objective:   Vitals:   08/16/18 1334  BP: 130/82  Pulse: 86  Weight: 296 lb (134.3 kg)    Fetal Status: Fetal Heart Rate (bpm): 132 Fundal Height: 35 cm Movement: Present  Presentation: Vertex  General:  Alert, oriented and cooperative. Patient is in no acute distress.  Skin: Skin is warm and dry. No rash noted.   Cardiovascular: Normal heart rate noted  Respiratory: Normal respiratory effort, no problems with respiration noted  Abdomen: Soft, gravid, appropriate for gestational age. Pain/Pressure: Absent     Pelvic:  Cervical exam deferred        Extremities: Normal range of motion.  Edema: None  Mental Status: Normal mood and affect. Normal behavior. Normal judgment and thought content.   Urinalysis:      Assessment and Plan:  Pregnancy: G1P0000 at [redacted]w[redacted]d  1. Supervision of high risk pregnancy in third trimester Routine care. Breast. bc options d/w pt. GBS nv  2. BMI 45.0-49.9, adult (HCC) stable  3. Morbid obesity (HCC)   Preterm labor symptoms and  general obstetric precautions including but not limited to vaginal bleeding, contractions, leaking of fluid and fetal movement were reviewed in detail with the patient. Please refer to After Visit Summary for other counseling recommendations.  Return in about 10 days (around 08/26/2018) for rob.   [place] Bing, MD

## 2018-08-26 ENCOUNTER — Other Ambulatory Visit: Payer: Self-pay

## 2018-08-26 ENCOUNTER — Ambulatory Visit (INDEPENDENT_AMBULATORY_CARE_PROVIDER_SITE_OTHER): Payer: 59 | Admitting: Obstetrics & Gynecology

## 2018-08-26 VITALS — BP 124/84 | HR 72 | Wt 296.0 lb

## 2018-08-26 DIAGNOSIS — Z3A36 36 weeks gestation of pregnancy: Secondary | ICD-10-CM

## 2018-08-26 DIAGNOSIS — O0993 Supervision of high risk pregnancy, unspecified, third trimester: Secondary | ICD-10-CM

## 2018-08-26 DIAGNOSIS — Z113 Encounter for screening for infections with a predominantly sexual mode of transmission: Secondary | ICD-10-CM

## 2018-08-26 DIAGNOSIS — O99213 Obesity complicating pregnancy, third trimester: Secondary | ICD-10-CM

## 2018-08-26 DIAGNOSIS — O99210 Obesity complicating pregnancy, unspecified trimester: Secondary | ICD-10-CM

## 2018-08-26 NOTE — Patient Instructions (Signed)
Return to office for any scheduled appointments. Call the office or go to the MAU at [facility] if:  You begin to have strong, frequent contractions  Your water breaks.  Sometimes it is a big gush of fluid, sometimes it is just a trickle that keeps getting your panties wet or running down your legs  You have vaginal bleeding.  It is normal to have a small amount of spotting if your cervix was checked.   You do not feel your baby moving like normal.  If you do not, get something to eat and drink and lay down and focus on feeling your baby move.   If your baby is still not moving like normal, you should call the office or go to MAU.  Any other obstetric concerns.   

## 2018-08-26 NOTE — Progress Notes (Signed)
PRENATAL VISIT NOTE  Subjective:  Jenna Bailey is a 28 y.o. G1P0000 at [redacted]w[redacted]d being seen today for ongoing prenatal care.  She is currently monitored for the following issues for this high-risk pregnancy and has GERD (gastroesophageal reflux disease); Morbid obesity (HCC); Frequent headaches; Hypertriglyceridemia; Preventative health care; Supervision of high-risk pregnancy; Maternal morbid obesity, antepartum (HCC); Nutritional counseling; and BMI 45.0-49.9, adult (HCC) on their problem list.  Patient reports no complaints.  Contractions: Not present. Vag. Bleeding: None.  Movement: Present. Denies leaking of fluid.   The following portions of the patient's history were reviewed and updated as appropriate: allergies, current medications, past family history, past medical history, past social history, past surgical history and problem list.   Objective:   Vitals:   08/26/18 1401  BP: 124/84  Pulse: 72  Weight: 296 lb (134.3 kg)    Fetal Status: Fetal Heart Rate (bpm): 140 Fundal Height: 36 cm Movement: Present  Presentation: Vertex (verified on bedside scan)  General:  Alert, oriented and cooperative. Patient is in no acute distress.  Skin: Skin is warm and dry. No rash noted.   Cardiovascular: Normal heart rate noted  Respiratory: Normal respiratory effort, no problems with respiration noted  Abdomen: Soft, gravid, appropriate for gestational age.  Pain/Pressure: Absent     Pelvic: Cervical exam performed Dilation: Closed Effacement (%): Thick Station: Ballotable  Extremities: Normal range of motion.  Edema: None  Mental Status: Normal mood and affect. Normal behavior. Normal judgment and thought content.   [place] [facility] Ob Follow Up  Result Date: 08/12/2018 ----------------------------------------------------------------------  OBSTETRICS REPORT                       (Signed Final 08/12/2018 05:57 pm) ---------------------------------------------------------------------- Patient Info  ID  #:       [id number]                          D.O.B.:  1990/12/26 (27 yrs)  Name:       Jenna Bailey                   Visit Date: 08/12/2018 01:31 pm ---------------------------------------------------------------------- Performed By  Performed By:     Lenise Arena        Ref. Address:      [address]                                                              [place], [place]  [address]  Attending:        Lin Landsman      [facility]  Referred By:      Tereso Newcomer MD ---------------------------------------------------------------------- Orders   #  Description                          Code         Ordered By   1  [place] [facility] OB FOLLOW UP                  83151.76     Lin Landsman  ----------------------------------------------------------------------   #  Order #                    Accession #                 Episode #   1  [id number]                  [id number]                  [id number]  ---------------------------------------------------------------------- Indications   Maternal morbid obesity                        O99.210 E66.01   Encounter for other antenatal screening        Z36.2   follow-up   [redacted] weeks gestation of pregnancy                Z3A.34  ---------------------------------------------------------------------- Vital Signs  Weight (lb): 294                               Height:        5'6"  BMI:         47.45 ---------------------------------------------------------------------- Fetal Evaluation  Num Of Fetuses:          1  Fetal Heart Rate(bpm):   134  Cardiac Activity:        Observed  Presentation:            Cephalic  Placenta:                Anterior  P. Cord  Insertion:       Previously Visualized  Amniotic Fluid  AFI FV:      Within normal limits  AFI Sum(cm)     %Tile       Largest Pocket(cm)  16.5            60  5.07  RUQ(cm)       RLQ(cm)       LUQ(cm)        LLQ(cm)  2.82          5.04          5.07           3.57 ---------------------------------------------------------------------- Biometry  BPD:      86.5  mm     G. Age:  34w 6d         73  %    CI:        78.01   %    70 - 86                                                          FL/HC:       21.7  %    19.4 - 21.8  HC:      309.9  mm     G. Age:  34w 4d         29  %    HC/AC:       0.92       0.96 - 1.11  AC:      337.5  mm     G. Age:  37w 5d       > 97  %    FL/BPD:      77.7  %    71 - 87  FL:       67.2  mm     G. Age:  34w 4d         55  %    FL/AC:       19.9  %    20 - 24  HUM:      55.5  mm     G. Age:  32w 2d         22  %  LV:        2.1  mm  Est. FW:    2890   gm     6 lb 6 oz   > 90  % ---------------------------------------------------------------------- OB History  Gravidity:    1         Term:   0        Prem:   0        SAB:   0  TOP:          0       Ectopic:  0        Living: 0 ---------------------------------------------------------------------- Gestational Age  LMP:           34w 0d        Date:  12/17/17                 EDD:   09/23/18  U/S Today:     35w 3d                                        EDD:   09/13/18  Best:          34w 0d     Det. By:  LMP  (12/17/17)  EDD:   09/23/18 ---------------------------------------------------------------------- Anatomy  Cranium:               Appears normal         Aortic Arch:            Previously seen  Cavum:                 Appears normal         Ductal Arch:            Appears normal  Ventricles:            Appears normal         Diaphragm:              Appears normal  Choroid Plexus:        Previously seen        Stomach:                Appears normal, left                                                                         sided  Cerebellum:            Previously seen        Abdomen:                Appears normal  Posterior Fossa:       Previously seen        Abdominal Wall:         Appears nml (cord                                                                        insert, abd wall)  Nuchal Fold:           Previously seen        Cord Vessels:           Previously seen  Face:                  Orbits and profile     Kidneys:                Previously seen                         previously seen  Lips:                  Previously seen        Bladder:                Appears normal  Thoracic:              Previously seen        Spine:                  Previously seen  Heart:                 Appears  normal         Upper Extremities:      Previously seen                         (4CH, axis, and situs  RVOT:                  Previously seen        Lower Extremities:      Previously seen  LVOT:                  Previously seen  Other:  Technically difficult due to maternal habitus and fetal position. ---------------------------------------------------------------------- Cervix Uterus Adnexa  Cervix  Normal appearance by transabdominal scan. ---------------------------------------------------------------------- Impression  Normal interval growth. ---------------------------------------------------------------------- Recommendations  Follow up growth in 4 weeks given BMI <40. ----------------------------------------------------------------------               Lin Landsman, MD Electronically Signed Final Report   08/12/2018 05:57 pm ----------------------------------------------------------------------   Assessment and Plan:  Pregnancy: G1P0000 at [redacted]w[redacted]d 1. Maternal morbid obesity, antepartum (HCC) Doing well. TWG -16 lbs, but she is eating well and maintaining her weight, not trying to lose weight. Most of the weight loss occurred in first trimester. EFW on 08/12/2018 was >90%, next scan ordered in about 2 weeks.   2. Supervision  of high risk pregnancy in third trimester Pelvic cultures done today - Culture, beta strep (group b only) - Cervicovaginal ancillary only( [place]) Preterm labor symptoms and general obstetric precautions including but not limited to vaginal bleeding, contractions, leaking of fluid and fetal movement were reviewed in detail with the patient. Please refer to After Visit Summary for other counseling recommendations.   Return in about 1 week (around 09/02/2018) for OB Visit.  Future Appointments  Date Time Provider Department Center  09/01/2018  4:00 PM Federico Flake, MD [facility]  09/09/2018 11:00 AM Macon Large Jethro Bastos, MD [facility]  09/09/2018  2:10 PM [facility] NURSE [facility]  09/09/2018  2:15 PM [facility] [place] 4 [facility]  09/16/2018  1:15 PM Pierce Bing, MD [facility]  01/24/2019  7:35 AM [facility]  07/26/2019  7:30 AM [facility]  07/29/2019  2:40 PM Doreene Nest, NP [facility]    Jaynie Collins, MD

## 2018-08-28 LAB — CERVICOVAGINAL ANCILLARY ONLY
Chlamydia: NEGATIVE
Neisseria Gonorrhea: NEGATIVE

## 2018-08-30 LAB — CULTURE, BETA STREP (GROUP B ONLY): Strep Gp B Culture: NEGATIVE

## 2018-09-01 ENCOUNTER — Other Ambulatory Visit: Payer: Self-pay

## 2018-09-01 ENCOUNTER — Ambulatory Visit (INDEPENDENT_AMBULATORY_CARE_PROVIDER_SITE_OTHER): Payer: 59 | Admitting: Family Medicine

## 2018-09-01 VITALS — BP 130/88 | HR 84 | Wt 299.0 lb

## 2018-09-01 DIAGNOSIS — O99210 Obesity complicating pregnancy, unspecified trimester: Secondary | ICD-10-CM

## 2018-09-01 DIAGNOSIS — Z3A36 36 weeks gestation of pregnancy: Secondary | ICD-10-CM

## 2018-09-01 DIAGNOSIS — O0993 Supervision of high risk pregnancy, unspecified, third trimester: Secondary | ICD-10-CM

## 2018-09-01 NOTE — Progress Notes (Signed)
   PRENATAL VISIT NOTE  Subjective:  Jenna Bailey is a 28 y.o. G1P0000 at [redacted]w[redacted]d being seen today for ongoing prenatal care.  She is currently monitored for the following issues for this low-risk pregnancy and has GERD (gastroesophageal reflux disease); Morbid obesity (HCC); Frequent headaches; Hypertriglyceridemia; Supervision of high-risk pregnancy; Maternal morbid obesity, antepartum (HCC); and BMI 45.0-49.9, adult (HCC) on their problem list.  Patient reports no complaints.  Contractions: Irritability. Vag. Bleeding: None.  Movement: Present. Denies leaking of fluid.   The following portions of the patient's history were reviewed and updated as appropriate: allergies, current medications, past family history, past medical history, past social history, past surgical history and problem list.   Objective:   Vitals:   09/01/18 1559  BP: 130/88  Pulse: 84  Weight: 299 lb (135.6 kg)    Fetal Status: Fetal Heart Rate (bpm): 139 Fundal Height: 38 cm Movement: Present     General:  Alert, oriented and cooperative. Patient is in no acute distress.  Skin: Skin is warm and dry. No rash noted.   Cardiovascular: Normal heart rate noted  Respiratory: Normal respiratory effort, no problems with respiration noted  Abdomen: Soft, gravid, appropriate for gestational age.  Pain/Pressure: Absent     Pelvic: Cervical exam deferred        Extremities: Normal range of motion.  Edema: None  Mental Status: Normal mood and affect. Normal behavior. Normal judgment and thought content.   Assessment and Plan:  Pregnancy: G1P0000 at [redacted]w[redacted]d 1. Supervision of high risk pregnancy in third trimester Up to date Discussed IOL at 41 wks Reviewed outpatient foley balloon.   2. Maternal morbid obesity, antepartum (HCC) TWG= -13 lb (-5.897 kg)   Preterm labor symptoms and general obstetric precautions including but not limited to vaginal bleeding, contractions, leaking of fluid and fetal movement were reviewed in  detail with the patient. Please refer to After Visit Summary for other counseling recommendations.   Return in about 1 week (around 09/08/2018) for Routine prenatal care.  Future Appointments  Date Time Provider Department Center  09/09/2018 11:00 AM Tereso Newcomer, MD [facility]  09/09/2018  2:10 PM [facility] NURSE [facility]  09/09/2018  2:15 PM [facility] [place] 4 [facility]  09/16/2018  1:15 PM [place] Bing, MD [facility]  01/24/2019  7:35 AM [facility]  07/26/2019  7:30 AM [facility]  07/29/2019  2:40 PM Doreene Nest, NP [facility]    Federico Flake, MD

## 2018-09-01 NOTE — Patient Instructions (Signed)
Go to the MAU (maternity admission unit) for 1) Strong contractions every 2-3 minutes for at least 1 hour that do not go away when you drink water or take a warm shower. These contractions will be so strong all you can do is breath through them 2) Vaginal bleeding- anything more than spotting 3) Loss of fluid like you broke your water 4) Decreased movement of your baby  

## 2018-09-09 ENCOUNTER — Ambulatory Visit (INDEPENDENT_AMBULATORY_CARE_PROVIDER_SITE_OTHER): Payer: 59 | Admitting: Obstetrics & Gynecology

## 2018-09-09 ENCOUNTER — Ambulatory Visit (HOSPITAL_COMMUNITY): Payer: 59

## 2018-09-09 ENCOUNTER — Encounter: Payer: Self-pay | Admitting: Obstetrics & Gynecology

## 2018-09-09 ENCOUNTER — Other Ambulatory Visit: Payer: Self-pay

## 2018-09-09 ENCOUNTER — Encounter (HOSPITAL_COMMUNITY): Payer: Self-pay

## 2018-09-09 VITALS — BP 124/84 | HR 90 | Wt 299.0 lb

## 2018-09-09 DIAGNOSIS — O99210 Obesity complicating pregnancy, unspecified trimester: Secondary | ICD-10-CM

## 2018-09-09 DIAGNOSIS — O0993 Supervision of high risk pregnancy, unspecified, third trimester: Secondary | ICD-10-CM

## 2018-09-09 DIAGNOSIS — O99213 Obesity complicating pregnancy, third trimester: Secondary | ICD-10-CM

## 2018-09-09 DIAGNOSIS — O3663X1 Maternal care for excessive fetal growth, third trimester, fetus 1: Secondary | ICD-10-CM

## 2018-09-09 DIAGNOSIS — Z3A38 38 weeks gestation of pregnancy: Secondary | ICD-10-CM

## 2018-09-09 NOTE — Progress Notes (Signed)
PRENATAL VISIT NOTE  Subjective:  Jenna Bailey is a 28 y.o. G1P0000 at [redacted]w[redacted]d being seen today for ongoing prenatal care.  She is currently monitored for the following issues for this high-risk pregnancy and has GERD (gastroesophageal reflux disease); Morbid obesity (HCC); Frequent headaches; Hypertriglyceridemia; Supervision of high-risk pregnancy; Maternal morbid obesity, antepartum (HCC); and BMI 45.0-49.9, adult (HCC) on their problem list.  Patient reports no complaints.  Contractions: Not present. Vag. Bleeding: None.  Movement: Present. Denies leaking of fluid.   The following portions of the patient's history were reviewed and updated as appropriate: allergies, current medications, past family history, past medical history, past social history, past surgical history and problem list.   Objective:   Vitals:   09/09/18 1103  BP: 124/84  Pulse: 90  Weight: 299 lb (135.6 kg)    Fetal Status: Fetal Heart Rate (bpm): 138   Movement: Present     General:  Alert, oriented and cooperative. Patient is in no acute distress.  Skin: Skin is warm and dry. No rash noted.   Cardiovascular: Normal heart rate noted  Respiratory: Normal respiratory effort, no problems with respiration noted  Abdomen: Soft, gravid, appropriate for gestational age.  Pain/Pressure: Present     Pelvic: Cervical exam deferred        Extremities: Normal range of motion.  Edema: None  Mental Status: Normal mood and affect. Normal behavior. Normal judgment and thought content.   Imaging: [place] Mfm Ob Follow Up  Result Date: 08/12/2018 ----------------------------------------------------------------------  OBSTETRICS REPORT                       (Signed Final 08/12/2018 05:57 pm) ---------------------------------------------------------------------- Patient Info  ID #:       [id number]                          D.O.B.:  12-31-1990 (27 yrs)  Name:       Jenna Bailey                   Visit Date: 08/12/2018 01:31 pm  ---------------------------------------------------------------------- Performed By  Performed By:     Lenise Arena        Ref. Address:      [address]                                                              [place], [place]  [address]  Attending:        Lin Landsman      [facility]  Referred By:      Tereso Newcomer MD ---------------------------------------------------------------------- Orders   #  Description                          Code         Ordered By   1  [place] MFM OB FOLLOW UP                  29562.13     Lin Landsman  ----------------------------------------------------------------------   #  Order #                    Accession #                 Episode #   1  [id number]                  [id number]                  [id number]  ---------------------------------------------------------------------- Indications   Maternal morbid obesity                        O99.210 E66.01   Encounter for other antenatal screening        Z36.2   follow-up   [redacted] weeks gestation of pregnancy                Z3A.34  ---------------------------------------------------------------------- Vital Signs  Weight (lb): 294                               Height:        5'6"  BMI:         47.45 ---------------------------------------------------------------------- Fetal Evaluation  Num Of Fetuses:          1  Fetal Heart Rate(bpm):   134  Cardiac Activity:        Observed  Presentation:            Cephalic  Placenta:                Anterior  P. Cord Insertion:       Previously Visualized  Amniotic Fluid  AFI FV:      Within normal limits  AFI Sum(cm)     %Tile       Largest Pocket(cm)  16.5             60  5.07  RUQ(cm)       RLQ(cm)       LUQ(cm)        LLQ(cm)  2.82          5.04          5.07           3.57 ---------------------------------------------------------------------- Biometry  BPD:      86.5  mm     G. Age:  34w 6d         73  %    CI:        78.01   %    70 - 86                                                          FL/HC:       21.7  %    19.4 - 21.8  HC:      309.9  mm     G. Age:  34w 4d         29  %    HC/AC:       0.92       0.96 - 1.11  AC:      337.5  mm     G. Age:  37w 5d       > 97  %    FL/BPD:      77.7  %    71 - 87  FL:       67.2  mm     G. Age:  34w 4d         55  %    FL/AC:       19.9  %    20 - 24  HUM:      55.5  mm     G. Age:  32w 2d         22  %  LV:        2.1  mm  Est. FW:    2890   gm     6 lb 6 oz   > 90  % ---------------------------------------------------------------------- OB History  Gravidity:    1         Term:   0        Prem:   0        SAB:   0  TOP:          0       Ectopic:  0        Living: 0 ---------------------------------------------------------------------- Gestational Age  LMP:           34w 0d        Date:  12/17/17                 EDD:   09/23/18  U/S Today:     35w 3d                                        EDD:   09/13/18  Best:          34w 0d     Det. By:  LMP  (12/17/17)  EDD:   09/23/18 ---------------------------------------------------------------------- Anatomy  Cranium:               Appears normal         Aortic Arch:            Previously seen  Cavum:                 Appears normal         Ductal Arch:            Appears normal  Ventricles:            Appears normal         Diaphragm:              Appears normal  Choroid Plexus:        Previously seen        Stomach:                Appears normal, left                                                                        sided  Cerebellum:            Previously seen        Abdomen:                Appears normal  Posterior Fossa:       Previously seen        Abdominal  Wall:         Appears nml (cord                                                                        insert, abd wall)  Nuchal Fold:           Previously seen        Cord Vessels:           Previously seen  Face:                  Orbits and profile     Kidneys:                Previously seen                         previously seen  Lips:                  Previously seen        Bladder:                Appears normal  Thoracic:              Previously seen        Spine:                  Previously seen  Heart:                 Appears  normal         Upper Extremities:      Previously seen                         (4CH, axis, and situs  RVOT:                  Previously seen        Lower Extremities:      Previously seen  LVOT:                  Previously seen  Other:  Technically difficult due to maternal habitus and fetal position. ---------------------------------------------------------------------- Cervix Uterus Adnexa  Cervix  Normal appearance by transabdominal scan. ---------------------------------------------------------------------- Impression  Normal interval growth. ---------------------------------------------------------------------- Recommendations  Follow up growth in 4 weeks given BMI <40. ----------------------------------------------------------------------               Lin Landsman, MD Electronically Signed Final Report   08/12/2018 05:57 pm ----------------------------------------------------------------------   Assessment and Plan:  Pregnancy: G1P0000 at [redacted]w[redacted]d 1. Maternal morbid obesity, antepartum (HCC) 2. Large for gestational age fetus affecting management of mother, antepartum, third trimester, fetus 1 3. Supervision of high risk pregnancy in third trimester Previously had follow up scan scheduled today, but was postponed by MFM. EFW 90%.  I called to reschedule it to 40 weeks and added BPP for postdates testing. Scheduled on 09/23/2018 at 1 pm, patient informed.  - [place] MFM OB  FOLLOW UP; Future - [place] MFM FETAL BPP WO NON STRESS; Future Preterm labor symptoms and general obstetric precautions including but not limited to vaginal bleeding, contractions, leaking of fluid and fetal movement were reviewed in detail with the patient. Please refer to After Visit Summary for other counseling recommendations.   Return in about 1 week (around 09/16/2018) for OFFICE OB Visit.  Future Appointments  Date Time Provider Department Center  09/16/2018  1:15 PM [place] Bing, MD [facility]  09/23/2018  1:00 PM [facility] NURSE [facility]  09/23/2018  1:00 PM [facility] [place] 3 [facility]  01/24/2019  7:35 AM [facility]  07/26/2019  7:30 AM [facility]  07/29/2019  2:40 PM Doreene Nest, NP [facility]    Jaynie Collins, MD

## 2018-09-09 NOTE — Patient Instructions (Signed)
Return to office for any scheduled appointments. Call the office or go to the MAU at [facility] if:  You begin to have strong, frequent contractions  Your water breaks.  Sometimes it is a big gush of fluid, sometimes it is just a trickle that keeps getting your panties wet or running down your legs  You have vaginal bleeding.  It is normal to have a small amount of spotting if your cervix was checked.   You do not feel your baby moving like normal.  If you do not, get something to eat and drink and lay down and focus on feeling your baby move.   If your baby is still not moving like normal, you should call the office or go to MAU.  Any other obstetric concerns.   

## 2018-09-16 ENCOUNTER — Encounter: Payer: 59 | Admitting: Obstetrics and Gynecology

## 2018-09-16 ENCOUNTER — Ambulatory Visit (INDEPENDENT_AMBULATORY_CARE_PROVIDER_SITE_OTHER): Payer: 59 | Admitting: Obstetrics and Gynecology

## 2018-09-16 ENCOUNTER — Other Ambulatory Visit: Payer: Self-pay

## 2018-09-16 VITALS — BP 127/84 | HR 76 | Wt 303.0 lb

## 2018-09-16 DIAGNOSIS — O99210 Obesity complicating pregnancy, unspecified trimester: Secondary | ICD-10-CM

## 2018-09-16 DIAGNOSIS — Z6841 Body Mass Index (BMI) 40.0 and over, adult: Secondary | ICD-10-CM

## 2018-09-16 DIAGNOSIS — O99213 Obesity complicating pregnancy, third trimester: Secondary | ICD-10-CM

## 2018-09-16 DIAGNOSIS — O3660X0 Maternal care for excessive fetal growth, unspecified trimester, not applicable or unspecified: Secondary | ICD-10-CM | POA: Insufficient documentation

## 2018-09-16 DIAGNOSIS — O3663X0 Maternal care for excessive fetal growth, third trimester, not applicable or unspecified: Secondary | ICD-10-CM

## 2018-09-16 DIAGNOSIS — Z3A39 39 weeks gestation of pregnancy: Secondary | ICD-10-CM

## 2018-09-16 DIAGNOSIS — O0993 Supervision of high risk pregnancy, unspecified, third trimester: Secondary | ICD-10-CM

## 2018-09-16 NOTE — Progress Notes (Signed)
Prenatal Visit Note Date: 09/16/2018 Clinic: [facility]  Subjective:  Jenna Bailey is a 28 y.o. G1P0000 at [redacted]w[redacted]d being seen today for ongoing prenatal care.  She is currently monitored for the following issues for this high-risk pregnancy and has GERD (gastroesophageal reflux disease); Morbid obesity (HCC); Frequent headaches; Hypertriglyceridemia; Supervision of high-risk pregnancy; Maternal morbid obesity, antepartum (HCC); and BMI 45.0-49.9, adult (HCC) on their problem list.  Patient reports no complaints.   Contractions: Not present. Vag. Bleeding: None.  Movement: Present. Denies leaking of fluid.   The following portions of the patient's history were reviewed and updated as appropriate: allergies, current medications, past family history, past medical history, past social history, past surgical history and problem list. Problem list updated.  Objective:   Vitals:   09/16/18 0900  BP: 127/84  Pulse: 76  Weight: (!) 303 lb (137.4 kg)    Fetal Status: Fetal Heart Rate (bpm): 142 Fundal Height: 40 cm Movement: Present  Presentation: Vertex  General:  Alert, oriented and cooperative. Patient is in no acute distress.  Skin: Skin is warm and dry. No rash noted.   Cardiovascular: Normal heart rate noted  Respiratory: Normal respiratory effort, no problems with respiration noted  Abdomen: Soft, gravid, appropriate for gestational age. Pain/Pressure: Present     Pelvic:  Cervical exam deferred        Extremities: Normal range of motion.  Edema: None  Mental Status: Normal mood and affect. Normal behavior. Normal judgment and thought content.   Urinalysis:      Assessment and Plan:  Pregnancy: G1P0000 at [redacted]w[redacted]d  1. Supervision of high risk pregnancy in third trimester Routine care. Set up for IOL at 41/0.   2. Maternal morbid obesity, antepartum (HCC) Weight good  3. BMI 45.0-49.9, adult (HCC)  4. LGA Has rpt u/s and bpp for next week. They will take bp  there so can defer 40 week visit. D/w her that her pelvis feels narrow (unable to tell dilation but feels somewhat effaced and mid position) and that we will keep a close watch on her labor curve and make sure she is progressing normally.   Term labor symptoms and general obstetric precautions including but not limited to vaginal bleeding, contractions, leaking of fluid and fetal movement were reviewed in detail with the patient. Please refer to After Visit Summary for other counseling recommendations.  Return in about 6 weeks (around 10/28/2018) for pp visit.   [place] Bing, MD

## 2018-09-17 ENCOUNTER — Telehealth (HOSPITAL_COMMUNITY): Payer: Self-pay | Admitting: *Deleted

## 2018-09-17 NOTE — Telephone Encounter (Signed)
Preadmission screen  

## 2018-09-20 ENCOUNTER — Telehealth: Payer: Self-pay | Admitting: *Deleted

## 2018-09-20 ENCOUNTER — Encounter (HOSPITAL_COMMUNITY): Payer: Self-pay | Admitting: *Deleted

## 2018-09-20 NOTE — Telephone Encounter (Signed)
Pt called stating she is having some swelling her right ankle only and wanted to make sure it was okay. Pt denies any tenderness or redness and does notice its better in the morning after she has been off her feet. States she did notice it happens after she is sitting with her opposite leg tucked under her. Told her it sounded normal and to watch how she is sitting and to elevate legs and feet when she could.

## 2018-09-21 ENCOUNTER — Encounter (HOSPITAL_COMMUNITY): Payer: Self-pay

## 2018-09-21 ENCOUNTER — Other Ambulatory Visit: Payer: Self-pay

## 2018-09-21 ENCOUNTER — Inpatient Hospital Stay (HOSPITAL_COMMUNITY)
Admission: AD | Admit: 2018-09-21 | Discharge: 2018-09-24 | DRG: 806 | Disposition: A | Discharge status: Home / Self Care | Payer: 59 | Attending: Family Medicine | Admitting: Family Medicine

## 2018-09-21 DIAGNOSIS — K219 Gastro-esophageal reflux disease without esophagitis: Secondary | ICD-10-CM | POA: Diagnosis present

## 2018-09-21 DIAGNOSIS — M955 Acquired deformity of pelvis: Secondary | ICD-10-CM | POA: Diagnosis present

## 2018-09-21 DIAGNOSIS — O9962 Diseases of the digestive system complicating childbirth: Secondary | ICD-10-CM | POA: Diagnosis present

## 2018-09-21 DIAGNOSIS — O0993 Supervision of high risk pregnancy, unspecified, third trimester: Secondary | ICD-10-CM

## 2018-09-21 DIAGNOSIS — O3663X0 Maternal care for excessive fetal growth, third trimester, not applicable or unspecified: Secondary | ICD-10-CM | POA: Diagnosis present

## 2018-09-21 DIAGNOSIS — O4290 Premature rupture of membranes, unspecified as to length of time between rupture and onset of labor, unspecified weeks of gestation: Secondary | ICD-10-CM | POA: Diagnosis present

## 2018-09-21 DIAGNOSIS — O4292 Full-term premature rupture of membranes, unspecified as to length of time between rupture and onset of labor: Secondary | ICD-10-CM | POA: Diagnosis present

## 2018-09-21 DIAGNOSIS — O4202 Full-term premature rupture of membranes, onset of labor within 24 hours of rupture: Secondary | ICD-10-CM | POA: Diagnosis not present

## 2018-09-21 DIAGNOSIS — O99214 Obesity complicating childbirth: Secondary | ICD-10-CM | POA: Diagnosis present

## 2018-09-21 DIAGNOSIS — Z3A39 39 weeks gestation of pregnancy: Secondary | ICD-10-CM | POA: Diagnosis not present

## 2018-09-21 DIAGNOSIS — O3660X0 Maternal care for excessive fetal growth, unspecified trimester, not applicable or unspecified: Secondary | ICD-10-CM | POA: Diagnosis present

## 2018-09-21 LAB — CBC
HCT: 35.1 % — ABNORMAL LOW (ref 36.0–46.0)
Hemoglobin: 11.5 g/dL — ABNORMAL LOW (ref 12.0–15.0)
MCH: 26.9 pg (ref 26.0–34.0)
MCHC: 32.8 g/dL (ref 30.0–36.0)
MCV: 82 fL (ref 80.0–100.0)
Platelets: 249 10*3/uL (ref 150–400)
RBC: 4.28 MIL/uL (ref 3.87–5.11)
RDW: 15.1 % (ref 11.5–15.5)
WBC: 9.7 10*3/uL (ref 4.0–10.5)
nRBC: 0 % (ref 0.0–0.2)

## 2018-09-21 LAB — RPR: RPR Ser Ql: NONREACTIVE

## 2018-09-21 LAB — TYPE AND SCREEN
ABO/RH(D): O POS
Antibody Screen: NEGATIVE

## 2018-09-21 LAB — POCT FERN TEST: POCT Fern Test: POSITIVE — AB

## 2018-09-21 MED ORDER — DIPHENHYDRAMINE HCL 50 MG/ML IJ SOLN: 12.5000 mg | INTRAMUSCULAR | Status: DC | PRN

## 2018-09-21 MED ORDER — OXYTOCIN BOLUS FROM INFUSION: 500.0000 mL | Freq: Once | INTRAVENOUS | Status: DC

## 2018-09-21 MED ORDER — LACTATED RINGERS IV SOLN
INTRAVENOUS | Status: DC
  Administered 2018-09-21 – 2018-09-22 (×4): via INTRAVENOUS

## 2018-09-21 MED ORDER — ONDANSETRON HCL 4 MG/2ML IJ SOLN: 4.0000 mg | Freq: Four times a day (QID) | INTRAMUSCULAR | Status: DC | PRN

## 2018-09-21 MED ORDER — EPHEDRINE 5 MG/ML INJ: 10.0000 mg | INTRAVENOUS | Status: DC | PRN

## 2018-09-21 MED ORDER — FENTANYL CITRATE (PF) 100 MCG/2ML IJ SOLN
100.0000 ug | INTRAMUSCULAR | Status: DC | PRN
  Administered 2018-09-21 – 2018-09-22 (×5): 100 ug via INTRAVENOUS
  Filled 2018-09-21 (×5): qty 2

## 2018-09-21 MED ORDER — OXYTOCIN 40 UNITS IN NORMAL SALINE INFUSION - SIMPLE MED
1.0000 m[IU]/min | INTRAVENOUS | Status: DC
  Administered 2018-09-21: 2 m[IU]/min via INTRAVENOUS
  Filled 2018-09-21: qty 1000

## 2018-09-21 MED ORDER — PANTOPRAZOLE SODIUM 40 MG PO TBEC
40.0000 mg | DELAYED_RELEASE_TABLET | Freq: Every day | ORAL | Status: DC
  Administered 2018-09-21 – 2018-09-22 (×2): 40 mg via ORAL
  Filled 2018-09-21 (×2): qty 1

## 2018-09-21 MED ORDER — FLEET ENEMA 7-19 GM/118ML RE ENEM: 1.0000 | ENEMA | RECTAL | Status: DC | PRN

## 2018-09-21 MED ORDER — LACTATED RINGERS IV SOLN: 500.0000 mL | INTRAVENOUS | Status: DC | PRN

## 2018-09-21 MED ORDER — OXYTOCIN 40 UNITS IN NORMAL SALINE INFUSION - SIMPLE MED: 2.5000 [IU]/h | INTRAVENOUS | Status: DC

## 2018-09-21 MED ORDER — SOD CITRATE-CITRIC ACID 500-334 MG/5ML PO SOLN: 30.0000 mL | ORAL | Status: DC | PRN

## 2018-09-21 MED ORDER — FENTANYL-BUPIVACAINE-NACL 0.5-0.125-0.9 MG/250ML-% EP SOLN: 12.0000 mL/h | EPIDURAL | Status: DC | PRN

## 2018-09-21 MED ORDER — LIDOCAINE HCL (PF) 1 % IJ SOLN
30.0000 mL | INTRAMUSCULAR | Status: AC | PRN
  Administered 2018-09-22: 04:00:00 30 mL via SUBCUTANEOUS
  Filled 2018-09-21: qty 30

## 2018-09-21 MED ORDER — PHENYLEPHRINE 40 MCG/ML (10ML) SYRINGE FOR IV PUSH (FOR BLOOD PRESSURE SUPPORT): 80.0000 ug | PREFILLED_SYRINGE | INTRAVENOUS | Status: DC | PRN

## 2018-09-21 MED ORDER — PANTOPRAZOLE SODIUM 40 MG PO TBEC: 40.0000 mg | DELAYED_RELEASE_TABLET | Freq: Every day | ORAL | Status: DC

## 2018-09-21 MED ORDER — MISOPROSTOL 50MCG HALF TABLET
50.0000 ug | ORAL_TABLET | ORAL | Status: DC | PRN
  Administered 2018-09-21 (×2): 50 ug via BUCCAL
  Filled 2018-09-21: qty 1

## 2018-09-21 MED ORDER — LACTATED RINGERS IV SOLN: 500.0000 mL | Freq: Once | INTRAVENOUS | Status: DC

## 2018-09-21 MED ORDER — TERBUTALINE SULFATE 1 MG/ML IJ SOLN: 0.2500 mg | Freq: Once | INTRAMUSCULAR | Status: DC | PRN

## 2018-09-21 MED ORDER — MISOPROSTOL 50MCG HALF TABLET
ORAL_TABLET | ORAL | Status: AC
  Filled 2018-09-21: qty 1

## 2018-09-21 NOTE — Progress Notes (Signed)
[facility]: Labor Progress Note  Subjective: Much more uncomfortable, feeling regular contractions now.   Objective: BP 136/83   Pulse (!) 59   Temp 98 F (36.7 C) (Oral)   Resp 20   Ht 5' 5.5" (1.664 m)   Wt (!) 139.9 kg   LMP 12/17/2017 (Exact Date)   SpO2 100%   BMI 50.56 kg/m  Gen: uncomfortable appearing, tearful Dilation: 5 Effacement (%): 70 Cervical Position: Posterior Station: -2 Presentation: Vertex Exam by:: Dr. Marlis Edelson   Assessment and Plan: 28 y.o. G1P0000 [redacted]w[redacted]d here for PROM around 0200 4/14.   Labor: Augmentation started with cytotec this morning then transitioned to pitocin around 1500. Discussed trying to be active for next hour or so then will get epidural and try to get some sleep this evening.   -- pain control: desires epidural -- PPH Risk: low  Fetal Well-Being: EFW >90% at 34w. Cephalic by sutures.  -- Category I - continuous fetal monitoring  -- GBS negative    Laurel S. Earlene Plater, DO OB/GYN Fellow, Faculty Practice  8:57 PM

## 2018-09-21 NOTE — Progress Notes (Signed)
Subjective: Jenna Bailey is a 28 y.o. G1P0000 at [redacted]w[redacted]d by LMP admitted for PROM of light meconium @ 0200.  Objective: BP 113/60 (BP Location: Left Arm)   Pulse 86   Temp 98 F (36.7 C) (Oral)   Resp 18   Ht 5' 5.5" (1.664 m)   Wt (!) 139.9 kg   LMP 12/17/2017 (Exact Date)   SpO2 100%   BMI 50.56 kg/m   FHT:  FHR: 140 bpm, variability: moderate,  accelerations:  Present,  decelerations:  Absent UC:   regular, every 4 minutes SVE:   Dilation: 1 Effacement (%): 50 Station: -3 Exam by:: Dr. Marlis Edelson  Labs: Lab Results  Component Value Date   WBC 9.7 09/21/2018   HGB 11.5 (L) 09/21/2018   HCT 35.1 (L) 09/21/2018   MCV 82.0 09/21/2018   PLT 249 09/21/2018    Assessment / Plan: Spontaneous labor, progressing normally  Labor: Progressing normally Preeclampsia:  n/a Fetal Wellbeing:  Category I Pain Control:  Labor support without medications I/D:  n/a Anticipated MOD:  NSVD  [name], CNM 09/21/2018, 9:54 AM

## 2018-09-21 NOTE — MAU Note (Signed)
Pt c/o SROM at 0156, green fluid. Denies cntx, some scant drops of bleeding. +FM.

## 2018-09-21 NOTE — Plan of Care (Signed)
Progressing through early labor as anticipated

## 2018-09-21 NOTE — H&P (Signed)
OBSTETRIC ADMISSION HISTORY AND PHYSICAL  Jenna Bailey is a 28 y.o. female G1P0000 with IUP at [redacted]w[redacted]d by L/14 presenting for PROM light meconium around 0200 this morning. Has not really been feeling any contractions.  Reports fetal movement. Denies vaginal bleeding.  She received her prenatal care at [facility].  Support person in labor: Husband Vinson Moselle . 14w1: unable to complete NT because of fetal position . 19w4: normal fetal anatomy U/S, no comment on placenta location . 30w0: EFW 79%, anterior placenta, normal interval growth . 34w0: EFW 2890g, >90%  Prenatal History/Complications: Marland Kitchen Maternal morbid obesity  Past Medical History: Past Medical History:  Diagnosis Date  . Abnormal uterine bleeding 08/25/2016  . Chicken pox   . GERD (gastroesophageal reflux disease)   . Hypertriglyceridemia 03/07/2015  . Morbid obesity (HCC) 06/22/2013  . Recurrent boils 07/26/2014  . Scabies 02/11/2018  . Vitamin D deficiency     Past Surgical History: Past Surgical History:  Procedure Laterality Date  . NO PAST SURGERIES    . WISDOM TOOTH EXTRACTION      Obstetrical History: OB History    Gravida  1   Para  0   Term  0   Preterm  0   AB  0   Living  0     SAB  0   TAB  0   Ectopic  0   Multiple  0   Live Births  0           Social History: Social History   Socioeconomic History  . Marital status: Married    Spouse name: Not on file  . Number of children: Not on file  . Years of education: Not on file  . Highest education level: Not on file  Occupational History  . Not on file  Social Needs  . Financial resource strain: Not hard at all  . Food insecurity:    Worry: Never true    Inability: Never true  . Transportation needs:    Medical: No    Non-medical: Not on file  Tobacco Use  . Smoking status: Never Smoker  . Smokeless tobacco: Never Used  Substance and Sexual Activity  . Alcohol use: No  . Drug use: No  . Sexual activity: Yes     Birth control/protection: None  Lifestyle  . Physical activity:    Days per week: Not on file    Minutes per session: Not on file  . Stress: Only a little  Relationships  . Social connections:    Talks on phone: Not on file    Gets together: Not on file    Attends religious service: Not on file    Active member of club or organization: Not on file    Attends meetings of clubs or organizations: Not on file    Relationship status: Not on file  Other Topics Concern  . Not on file  Social History Narrative   Married.   No children, undergoing fertility treatments.   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   Enjoys reading, traveling, watching TV.    Family History: Family History  Problem Relation Age of Onset  . Diabetes Paternal Grandmother   . Depression Father   . Alcohol abuse Father   . Hypertension Mother     Allergies: No Known Allergies  Medications Prior to Admission  Medication Sig Dispense Refill Last Dose  . aspirin EC 81 MG tablet Take 1 tablet (81 mg total)  by mouth daily. Take after 12 weeks for prevention of preeclampsia later in pregnancy 300 tablet 2 09/20/2018 at Unknown time  . cholecalciferol (VITAMIN D3) 25 MCG (1000 UT) tablet Take 1,000 Units by mouth daily.   09/20/2018 at Unknown time  . Magnesium Malate 1250 (141.7 Mg) MG TABS Take 1 tablet by mouth daily as needed (headache). 60 tablet  09/20/2018 at Unknown time  . omeprazole (PRILOSEC) 40 MG capsule Take 1 capsule (40 mg total) by mouth daily. For heartburn. 90 capsule 3 09/20/2018 at Unknown time  . Prenatal Vit-Fe Fumarate-FA (PRENATAL VITAMIN PO) Take by mouth daily.   09/20/2018 at Unknown time     Review of Systems  All systems reviewed and negative except as stated in HPI  Blood pressure 92/66, pulse 74, temperature 97.8 F (36.6 C), temperature source Oral, resp. rate 18, height 5' 5.5" (1.664 m), weight (!) 139.9 kg, last menstrual period 12/17/2017, SpO2 100 %. General appearance:  well-appearing, NAD Lungs: no respiratory distress Heart: regular rate  Abdomen: soft, non-tender; gravid  Pelvic: deferred Extremities: no LE edema Presentation: cephalic by sutures  Fetal monitoring: 140s/mod/+a/-d Uterine activity: irritability Dilation: 1 Effacement (%): 50 Station: -3 Exam by:: Dr. Marlis Edelson  Prenatal labs: ABO, Rh: --/--/O POS, O POS Performed at [facility], [address] [place], [place] [address]  [phone]) Antibody: NEG (04/14 0318) Rubella: 1.10 (10/01 1425) RPR: Non Reactive (01/28 0833)  HBsAg: Negative (10/01 1425)  HIV: Non Reactive (01/28 0833)  GBS:   negative Glucola: normal 2-hr Genetic screening:  Negative quad screen  Prenatal Transfer Tool  Maternal Diabetes: No Genetic Screening: Normal Maternal Ultrasounds/Referrals: Normal - LGA Fetal Ultrasounds or other Referrals:  None Maternal Substance Abuse:  No Significant Maternal Medications:  Omeprazole, PNV Significant Maternal Lab Results: None  Results for orders placed or performed during the hospital encounter of 09/21/18 (from the past 24 hour(s))  Fern Test   Collection Time: 09/21/18  3:12 AM  Result Value Ref Range   POCT Fern Test Positive = ruptured amniotic membanes (A)   CBC   Collection Time: 09/21/18  3:18 AM  Result Value Ref Range   WBC 9.7 4.0 - 10.5 K/uL   RBC 4.28 3.87 - 5.11 MIL/uL   Hemoglobin 11.5 (L) 12.0 - 15.0 g/dL   HCT 08.6 (L) 57.8 - 46.9 %   MCV 82.0 80.0 - 100.0 fL   MCH 26.9 26.0 - 34.0 pg   MCHC 32.8 30.0 - 36.0 g/dL   RDW 62.9 52.8 - 41.3 %   Platelets 249 150 - 400 K/uL   nRBC 0.0 0.0 - 0.2 %  Type and screen [facility]   Collection Time: 09/21/18  3:18 AM  Result Value Ref Range   ABO/RH(D) O POS    Antibody Screen NEG    Sample Expiration      09/24/2018 Performed at [facility], [address] [place], [place] [address]   ABO/Rh   Collection Time: 09/21/18  3:18 AM  Result Value Ref Range    ABO/RH(D)      O POS Performed at [facility], [address] [place], [place] [address]     Patient Active Problem List   Diagnosis Date Noted  . Indication for care in labor or delivery 09/21/2018  . PROM (premature rupture of membranes) 09/21/2018  . LGA (large for gestational age) fetus affecting management of mother 09/16/2018  . BMI 45.0-49.9, adult (HCC)  06/10/2018  . Supervision of high-risk pregnancy 03/09/2018  . Maternal morbid obesity, antepartum (HCC) 03/09/2018  . Hypertriglyceridemia 03/07/2015  . Frequent headaches 07/20/2013  . GERD (gastroesophageal reflux disease) 06/22/2013  . Morbid obesity (HCC) 06/22/2013    Assessment/Plan:  Halina MaidensKatie K Melfi is a 28 y.o. G1P0000 at 6438w5d here for PROM light meconium around 0200 this morning.   Labor: Will augment with cytotec. Extremely difficult exam - narrow pelvis and cervix posterior. Will attempt FB with next check.  -- pain control: open to options  Fetal Wellbeing: EFW difficult to obtain by [facility] due to maternal body habitus, EFW >90% at 34w0 U/S. Cephalic by sutures.  -- GBS (negative) -- continuous fetal monitoring - category I   Postpartum Planning -- breast/undecided -- RI/[x] Tdap/[x] flu  Marshun Duva S. Earlene PlaterWallace, DO OB/GYN Fellow

## 2018-09-22 ENCOUNTER — Encounter (HOSPITAL_COMMUNITY): Payer: Self-pay | Admitting: *Deleted

## 2018-09-22 DIAGNOSIS — O4202 Full-term premature rupture of membranes, onset of labor within 24 hours of rupture: Secondary | ICD-10-CM

## 2018-09-22 DIAGNOSIS — Z3A39 39 weeks gestation of pregnancy: Secondary | ICD-10-CM

## 2018-09-22 LAB — CBC
HCT: 28.9 % — ABNORMAL LOW (ref 36.0–46.0)
Hemoglobin: 9.7 g/dL — ABNORMAL LOW (ref 12.0–15.0)
MCH: 26.9 pg (ref 26.0–34.0)
MCHC: 33.6 g/dL (ref 30.0–36.0)
MCV: 80.1 fL (ref 80.0–100.0)
Platelets: 267 10*3/uL (ref 150–400)
RBC: 3.61 MIL/uL — ABNORMAL LOW (ref 3.87–5.11)
RDW: 14.8 % (ref 11.5–15.5)
WBC: 17.8 10*3/uL — ABNORMAL HIGH (ref 4.0–10.5)
nRBC: 0 % (ref 0.0–0.2)

## 2018-09-22 LAB — ABO/RH: ABO/RH(D): O POS

## 2018-09-22 MED ORDER — WITCH HAZEL-GLYCERIN EX PADS: 1.0000 "application " | MEDICATED_PAD | CUTANEOUS | Status: DC | PRN

## 2018-09-22 MED ORDER — IBUPROFEN 600 MG PO TABS
600.0000 mg | ORAL_TABLET | Freq: Four times a day (QID) | ORAL | Status: DC
  Administered 2018-09-22 – 2018-09-24 (×10): 600 mg via ORAL
  Filled 2018-09-22 (×10): qty 1

## 2018-09-22 MED ORDER — COCONUT OIL OIL
1.0000 "application " | TOPICAL_OIL | Status: DC | PRN
  Administered 2018-09-23: 1 via TOPICAL

## 2018-09-22 MED ORDER — DOCUSATE SODIUM 100 MG PO CAPS
100.0000 mg | ORAL_CAPSULE | Freq: Two times a day (BID) | ORAL | Status: DC
  Administered 2018-09-22 – 2018-09-24 (×2): 100 mg via ORAL
  Filled 2018-09-22 (×3): qty 1

## 2018-09-22 MED ORDER — ONDANSETRON HCL 4 MG/2ML IJ SOLN: 4.0000 mg | INTRAMUSCULAR | Status: DC | PRN

## 2018-09-22 MED ORDER — DIBUCAINE (PERIANAL) 1 % EX OINT: 1.0000 "application " | TOPICAL_OINTMENT | CUTANEOUS | Status: DC | PRN

## 2018-09-22 MED ORDER — ZOLPIDEM TARTRATE 5 MG PO TABS: 5.0000 mg | ORAL_TABLET | Freq: Every evening | ORAL | Status: DC | PRN

## 2018-09-22 MED ORDER — ACETAMINOPHEN 325 MG PO TABS: 650.0000 mg | ORAL_TABLET | ORAL | Status: DC | PRN

## 2018-09-22 MED ORDER — DIPHENHYDRAMINE HCL 25 MG PO CAPS: 25.0000 mg | ORAL_CAPSULE | Freq: Four times a day (QID) | ORAL | Status: DC | PRN

## 2018-09-22 MED ORDER — ONDANSETRON HCL 4 MG PO TABS: 4.0000 mg | ORAL_TABLET | ORAL | Status: DC | PRN

## 2018-09-22 MED ORDER — BENZOCAINE-MENTHOL 20-0.5 % EX AERO
1.0000 "application " | INHALATION_SPRAY | CUTANEOUS | Status: DC | PRN
  Administered 2018-09-22: 1 via TOPICAL
  Filled 2018-09-22: qty 56

## 2018-09-22 MED ORDER — SIMETHICONE 80 MG PO CHEW: 80.0000 mg | CHEWABLE_TABLET | ORAL | Status: DC | PRN

## 2018-09-22 MED ORDER — PRENATAL MULTIVITAMIN CH
1.0000 | ORAL_TABLET | Freq: Every day | ORAL | Status: DC
  Administered 2018-09-22 – 2018-09-24 (×3): 1 via ORAL
  Filled 2018-09-22 (×3): qty 1

## 2018-09-22 NOTE — Lactation Note (Signed)
This note was copied from a baby's chart. Lactation Consultation Note  Patient Name: Jenna Bailey JGGEZ'M Date: 09/22/2018 Reason for consult: Initial assessment;1st time breastfeeding  Initial visit of P1 mom, baby is now 8 hours old LC entered room to find infant stirring somewhat in crib. Parents state baby last fed around 0800. Offered to assist mother with latch now and mom agrees. FOB at beside and engaged/supportive. Reviewed ways to wake a sleepy baby or to keep baby awake at the breast re: STS, check diaper, burping. Mom denies any breast changes or leaking during pregnancy, only that her areola got bigger and darker.  Mom states she has been shown hand expression; LC reinforced and drops of colostrum easily expressed. Mom with large but soft compressible breasts and nipples that evert with minimal stimulation. Infant easily latched to left breast in football hold using pillows and mom's boppy pillow for support. Also demonstrated cross-cradle hold. Reviewed basic latch re: waiting for infant to open wide, "rock" baby on with bottom lip touching first, nose/chin touching breast during feeding, lips flanged, rhthymic sucking. Audible swallows noted. Demonstrated how to release a poor/painful latch.  Mom given lactation brochure with phone number, extra feeding sheet, and virtual breastfeeding support group handout. Notified of IP/OP lactation services. Reviewed feeding baby 8-12 times in 24 hours and with feeding cues. Reviewed feeding cues. Encouraged to call with any concerns or problems.  Infant still feeding after 7 minutes when LC left room.   Maternal Data Has patient been taught Hand Expression?: Yes(reinforced) Does the patient have breastfeeding experience prior to this delivery?: No  Feeding Feeding Type: Breast Fed  LATCH Score Latch: Grasps breast easily, tongue down, lips flanged, rhythmical sucking.  Audible Swallowing: Spontaneous and intermittent  Type  of Nipple: Everted at rest and after stimulation  Comfort (Breast/Nipple): Soft / non-tender  Hold (Positioning): Assistance needed to correctly position infant at breast and maintain latch.  LATCH Score: 9  Interventions Interventions: Breast feeding basics reviewed;Assisted with latch;Skin to skin;Breast massage;Hand express;Support pillows;Position options   Consult Status Consult Status: Follow-up Date: 09/23/18 Follow-up type: In-patient    Virgia Land 09/22/2018, 11:52 AM

## 2018-09-22 NOTE — Lactation Note (Signed)
This note was copied from a baby's chart. Lactation Consultation Note  Patient Name: Jenna Bailey POEUM'P Date: 09/22/2018 Reason for consult: Mother's request  LC called by nursing staff for assistance with feeding per mom's request. LC entered room to find mom attempting to latch infant on left breast in football hold. Mom states she just tried to feed on the right side without success. Infant has been cuing and mom trying to latch baby.  Infant noted to be awake and alert at mom's breast and in somewhat awkward position. Boppy pillow seems to be hindering positioning at the breast. Infant repositioned to be at the level of mom's breast and infant easily latched. Reminded mom about using pillows for support and sandwiching her breast tissue to allow infant to latch. Infant with tendency to suck on bottom lip. LC demonstrated to both parents how to tug on bottom jaw to help make bottom lip more flanged. Parents verbalized understanding and infant breastfeeding upon LC exit from room.  Maternal Data Has patient been taught Hand Expression?: Yes(reinforced) Does the patient have breastfeeding experience prior to this delivery?: No  Feeding Feeding Type: Breast Fed  LATCH Score Latch: Grasps breast easily, tongue down, lips flanged, rhythmical sucking.  Audible Swallowing: Spontaneous and intermittent  Type of Nipple: Everted at rest and after stimulation  Comfort (Breast/Nipple): Soft / non-tender  Hold (Positioning): Assistance needed to correctly position infant at breast and maintain latch.  LATCH Score: 9  Interventions Interventions: Assisted with latch;Skin to skin;Breast massage  Lactation Tools Discussed/Used     Consult Status Consult Status: Follow-up Date: 09/23/18 Follow-up type: In-patient    Virgia Land 09/22/2018, 2:20 PM

## 2018-09-22 NOTE — Discharge Summary (Signed)
Obstetrics Discharge Summary OB/GYN Faculty Practice   Patient Name: Jenna Bailey DOB: [birth year] MRN: [id number]  Date of admission: 09/21/2018 Delivering MD: Tamera StandsWALLACE, Queenie Aufiero S   Date of discharge: 09/23/2018  Admitting diagnosis: pregnancy Intrauterine pregnancy: 3345w6d     Secondary diagnosis:   Principal Problem:   PROM (premature rupture of membranes) Active Problems:   Morbid obesity (HCC)   LGA (large for gestational age) fetus affecting management of mother   Indication for care in labor or delivery   Discharge diagnosis: Term Pregnancy Delivered by Vacuum for Fetal Bradycardia                                            Postpartum procedures: None  Complications: PPH with EBL of about 1L - HgB trended from 11.8 to 9.9, asymptomatic  Outpatient Follow-Up: [ ]  desires IUD at postpartum visit   Hospital course: Jenna Bailey is a 28 y.o. 3145w6d who was admitted for augmentation of labor for PROM. Her pregnancy was complicated by above noted. Her labor course was notable for augmentation with cytotec and pitocin. Progressed to complete and pushed effectively but fetal bradycardia noted with pushing necessitating outlet vacuum. Delivery was otherwise complicated by 2nd degree perineal and right labial/periurethral lacerations which were repaired. Please see delivery/op note for additional details. Her postpartum course was uncomplicated. She was breastfeeding without difficulty. By day of discharge, she was passing flatus, urinating, eating and drinking without difficulty. Her pain was well-controlled, and she was discharged home with ibuprofen. She will follow-up in clinic in 4-6 weeks.   Physical exam  Vitals:   09/22/18 1530 09/22/18 1755 09/22/18 2300 09/23/18 0517  BP: 116/65 118/77 128/73 111/73  Pulse: 89 96 87 69  Resp: 20 20 20 20   Temp: 98.4 F (36.9 C) 98.7 F (37.1 C) 98.2 F (36.8 C) 98.2 F (36.8 C)  TempSrc: Oral Oral Oral Oral  SpO2:      Weight:       Height:       General: well-appearing, NAD Lochia: appropriate Uterine Fundus: firm Incision: N/A DVT Evaluation: Trace calf/ankle edema  Labs: Lab Results  Component Value Date   WBC 17.8 (H) 09/22/2018   HGB 9.7 (L) 09/22/2018   HCT 28.9 (L) 09/22/2018   MCV 80.1 09/22/2018   PLT 267 09/22/2018   CMP Latest Ref Rng & Units 07/06/2018  Glucose 65 - 99 mg/dL 82  BUN 6 - 20 mg/dL 5(L)  Creatinine 6.960.57 - 1.00 mg/dL 2.95(M0.43(L)  Sodium 841134 - 324144 mmol/L 140  Potassium 3.5 - 5.2 mmol/L 4.5  Chloride 96 - 106 mmol/L 102  CO2 20 - 29 mmol/L 21  Calcium 8.7 - 10.2 mg/dL 9.1  Total Protein 6.0 - 8.5 g/dL 6.2  Total Bilirubin 0.0 - 1.2 mg/dL 0.2  Alkaline Phos 39 - 117 IU/L 94  AST 0 - 40 IU/L 16  ALT 0 - 32 IU/L 15    Discharge instructions: Per After Visit Summary and "Baby and Me Booklet"  After visit meds:  Allergies as of 09/23/2018   No Known Allergies     Medication List    TAKE these medications   cholecalciferol 25 MCG (1000 UT) tablet Commonly known as:  VITAMIN D3 Take 1,000 Units by mouth daily.   ibuprofen 800 MG tablet Commonly known as:  ADVIL,MOTRIN Take 1 tablet (800 mg total) by mouth  3 (three) times daily.   Magnesium Malate 1250 (141.7 Mg) MG Tabs Take 1 tablet by mouth daily as needed (headache).   omeprazole 40 MG capsule Commonly known as:  PRILOSEC Take 1 capsule (40 mg total) by mouth daily. For heartburn.   PRENATAL VITAMIN PO Take by mouth daily.      Postpartum contraception: IUD Diet: Routine Diet Activity: Advance as tolerated. Pelvic rest for 6 weeks.   Follow-up Appt: Future Appointments  Date Time Provider Department Center  11/03/2018 10:30 AM Federico Flake, MD [facility]  01/24/2019  7:35 AM [facility]  07/26/2019  7:30 AM [facility]  07/29/2019  2:40 PM Doreene Nest, NP [facility]   Please schedule this patient for Postpartum visit in: 4-6 weeks with the following  provider: Any provider Low risk pregnancy complicated by: LGA, obesity Delivery mode:  Vacuum Anticipated Birth Control:  IUD PP Procedures needed: none  Schedule Integrated BH visit: no  Newborn Data: Live born female  Birth Weight:  3510 APGAR: 8, 9  Newborn Delivery   Birth date/time:  09/22/2018 03:25:00 Delivery type:  Vaginal, Vacuum (Extractor)    Baby Feeding: Breast Disposition:home with mother  Cristal Deer. Earlene Plater, DO OB/GYN Fellow, Faculty Practice

## 2018-09-22 NOTE — Progress Notes (Signed)
OB/GYN Faculty Practice: Labor Progress Note  Subjective: Uncomfortable, feeling lots of pressure during contractions. Called to room by RN because of prolonged decelerations with back-to-back contractions which look almost like early decelerations.   Objective: BP 129/78   Pulse 71   Temp (!) 97.5 F (36.4 C) (Oral)   Resp 20   Ht 5' 5.5" (1.664 m)   Wt (!) 139.9 kg   LMP 12/17/2017 (Exact Date)   SpO2 100%   BMI 50.56 kg/m  Gen: uncomfortable appearing, tearful Dilation: Lip/rim Effacement (%): 100 Cervical Position: Posterior Station: 0 Presentation: Vertex Exam by:: Irving Burton Rothermel RN   Assessment and Plan: 28 y.o. G1P0000 [redacted]w[redacted]d here for PROM around 0200 4/14.   Labor: Progressing well with pitocin augmentation. Attempted IUPC placement to better monitor decelerations but unable to place because of patient tolerance. -- pain control: desires epidural -- PPH Risk: low  Fetal Well-Being: EFW >90% at 34w. Cephalic by sutures.  -- Category I - continuous fetal monitoring  -- GBS negative    Lynn Recendiz S. Earlene Plater, DO OB/GYN Fellow, Faculty Practice  2:26 AM

## 2018-09-23 ENCOUNTER — Ambulatory Visit (HOSPITAL_COMMUNITY): Payer: 59

## 2018-09-23 MED ORDER — PANTOPRAZOLE SODIUM 40 MG PO TBEC
40.0000 mg | DELAYED_RELEASE_TABLET | Freq: Once | ORAL | Status: AC
  Administered 2018-09-23: 40 mg via ORAL
  Filled 2018-09-23: qty 1

## 2018-09-23 MED ORDER — IBUPROFEN 800 MG PO TABS: 800.0000 mg | ORAL_TABLET | Freq: Three times a day (TID) | ORAL | 0 refills | Status: DC

## 2018-09-23 NOTE — Lactation Note (Signed)
This note was copied from a baby's chart. Lactation Consultation Note  Patient Name: Jenna Bailey UUVOZ'D Date: 09/23/2018 Reason for consult: Follow-up assessment;Term;Primapara;1st time breastfeeding  P1 mother whose infant is now 39 hours old.  Mother had no questions/concerns related to breast feeding.  She stated that her RN from last night and today have assisted her.  Baby was asleep in bassinet.  Encouraged mother to continue feeding 8-12 times/24 hours or sooner if baby shows feeding cues.  She is familiar with feeding cues and hand expression.  Mother will call for latch assistance as needed.  Father present.   Maternal Data Formula Feeding for Exclusion: No Has patient been taught Hand Expression?: Yes Does the patient have breastfeeding experience prior to this delivery?: No  Feeding Feeding Type: Breast Fed  LATCH Score Latch: Repeated attempts needed to sustain latch, nipple held in mouth throughout feeding, stimulation needed to elicit sucking reflex.  Audible Swallowing: A few with stimulation  Type of Nipple: Everted at rest and after stimulation  Comfort (Breast/Nipple): Soft / non-tender  Hold (Positioning): No assistance needed to correctly position infant at breast.  LATCH Score: 8  Interventions    Lactation Tools Discussed/Used     Consult Status Consult Status: Follow-up Date: 09/24/18 Follow-up type: In-patient    Jenna Bailey 09/23/2018, 12:19 PM

## 2018-09-24 MED ORDER — PANTOPRAZOLE SODIUM 40 MG PO TBEC
40.0000 mg | DELAYED_RELEASE_TABLET | Freq: Once | ORAL | Status: AC
  Administered 2018-09-24: 10:00:00 40 mg via ORAL
  Filled 2018-09-24: qty 1

## 2018-09-24 NOTE — Lactation Note (Addendum)
This note was copied from a baby's chart. Lactation Consultation Note:  Mother reports just finishing a feeding. Infant is now 6 hours old.  2% weight loss. Voids and stools adequate. Father reports that infant cluster fed during the night. Informed that this is normal newborn infant behavior and that infant would continue to cluster feed for several more nights.  Mother denies pain on initial latch , only a little tender.  Observed mothers nipple being pink.  Mother is able to hand express milk.  No fullness notes yet.  Discussed treatment and prevention of engorgement  Discussed soothing techniques with parents.  Mother was given a hand pump with instructions to use when milk coming in to pre-pump as needed.  Mother up to chair and self latched infant on the left breast. Observed that infant is sleepy and jaundice. When infant released the breast observed pinching.  Assessed infants oral cavity and observed a thin short frenula.   Baby sit ups and switch nursing to rouse infant.  Infant placed in cross cradle hold. Infant sustained latch for 20 mins while I observed . Good milk transfer. Nipple was round without pinching.Plan to repeat infants Bili this afternoon to determine discharge plans.   Staff nurse to give mother a #27 flange for her hand pump. Mother reports that she has a Motif DEBP at home.   Mother is aware of available LC services and 24/7 phone line for breastfeeding questions or concerns.  Patient Name: Jenna Bailey IRSWN'I Date: 09/24/2018 Reason for consult: Follow-up assessment   Maternal Data    Feeding Feeding Type: (infant sleeping in mothers arms)  LATCH Score                   Interventions Interventions: Breast massage;Hand express;Breast compression;Expressed milk;Hand pump  Lactation Tools Discussed/Used     Consult Status Consult Status: Follow-up Date: 09/24/18 Follow-up type: In-patient    Stevan Born  [facility] 09/24/2018, 9:50 AM

## 2018-09-24 NOTE — Discharge Instructions (Signed)
Vaginal Delivery, Care After °Refer to this sheet in the next few weeks. These instructions provide you with information about caring for yourself after vaginal delivery. Your health care provider may also give you more specific instructions. Your treatment has been planned according to current medical practices, but problems sometimes occur. Call your health care provider if you have any problems or questions. °What can I expect after the procedure? °After vaginal delivery, it is common to have: °· Some bleeding from your vagina. °· Soreness in your abdomen, your vagina, and the area of skin between your vaginal opening and your anus (perineum). °· Pelvic cramps. °· Fatigue. °Follow these instructions at home: °Medicines °· Take over-the-counter and prescription medicines only as told by your health care provider. °· If you were prescribed an antibiotic medicine, take it as told by your health care provider. Do not stop taking the antibiotic until it is finished. °Driving ° °· Do not drive or operate heavy machinery while taking prescription pain medicine. °· Do not drive for 24 hours if you received a sedative. °Lifestyle °· Do not drink alcohol. This is especially important if you are breastfeeding or taking medicine to relieve pain. °· Do not use tobacco products, including cigarettes, chewing tobacco, or e-cigarettes. If you need help quitting, ask your health care provider. °Eating and drinking °· Drink at least 8 eight-ounce glasses of water every day unless you are told not to by your health care provider. If you choose to breastfeed your baby, you may need to drink more water than this. °· Eat high-fiber foods every day. These foods may help prevent or relieve constipation. High-fiber foods include: °? Whole grain cereals and breads. °? Jacquet rice. °? Beans. °? Fresh fruits and vegetables. °Activity °· Return to your normal activities as told by your health care provider. Ask your health care provider what  activities are safe for you. °· Rest as much as possible. Try to rest or take a nap when your baby is sleeping. °· Do not lift anything that is heavier than your baby or 10 lb (4.5 kg) until your health care provider says that it is safe. °· Talk with your health care provider about when you can engage in sexual activity. This may depend on your: °? Risk of infection. °? Rate of healing. °? Comfort and desire to engage in sexual activity. °Vaginal Care °· If you have an episiotomy or a vaginal tear, check the area every day for signs of infection. Check for: °? More redness, swelling, or pain. °? More fluid or blood. °? Warmth. °? Pus or a bad smell. °· Do not use tampons or douches until your health care provider says this is safe. °· Watch for any blood clots that may pass from your vagina. These may look like clumps of dark red, Sickinger, or black discharge. °General instructions °· Keep your perineum clean and dry as told by your health care provider. °· Wear loose, comfortable clothing. °· Wipe from front to back when you use the toilet. °· Ask your health care provider if you can shower or take a bath. If you had an episiotomy or a perineal tear during labor and delivery, your health care provider may tell you not to take baths for a certain length of time. °· Wear a bra that supports your breasts and fits you well. °· If possible, have someone help you with household activities and help care for your baby for at least a few days after you   leave the hospital. °· Keep all follow-up visits for you and your baby as told by your health care provider. This is important. °Contact a health care provider if: °· You have: °? Vaginal discharge that has a bad smell. °? Difficulty urinating. °? Pain when urinating. °? A sudden increase or decrease in the frequency of your bowel movements. °? More redness, swelling, or pain around your episiotomy or vaginal tear. °? More fluid or blood coming from your episiotomy or vaginal  tear. °? Pus or a bad smell coming from your episiotomy or vaginal tear. °? A fever. °? A rash. °? Little or no interest in activities you used to enjoy. °? Questions about caring for yourself or your baby. °· Your episiotomy or vaginal tear feels warm to the touch. °· Your episiotomy or vaginal tear is separating or does not appear to be healing. °· Your breasts are painful, hard, or turn red. °· You feel unusually sad or worried. °· You feel nauseous or you vomit. °· You pass large blood clots from your vagina. If you pass a blood clot from your vagina, save it to show to your health care provider. Do not flush blood clots down the toilet without having your health care provider look at them. °· You urinate more than usual. °· You are dizzy or light-headed. °· You have not breastfed at all and you have not had a menstrual period for 12 weeks after delivery. °· You have stopped breastfeeding and you have not had a menstrual period for 12 weeks after you stopped breastfeeding. °Get help right away if: °· You have: °? Pain that does not go away or does not get better with medicine. °? Chest pain. °? Difficulty breathing. °? Blurred vision or spots in your vision. °? Thoughts about hurting yourself or your baby. °· You develop pain in your abdomen or in one of your legs. °· You develop a severe headache. °· You faint. °· You bleed from your vagina so much that you fill two sanitary pads in one hour. °This information is not intended to replace advice given to you by your health care provider. Make sure you discuss any questions you have with your health care provider. °Document Released: 05/23/2000 Document Revised: 11/07/2015 Document Reviewed: 06/10/2015 °Elsevier Interactive Patient Education © 2019 [employer] Inc. ° °

## 2018-09-24 NOTE — Discharge Summary (Signed)
Obstetrics Discharge Summary OB/[facility]N [facility]   Patient Name: Jenna MaidensKatie K Frese DOB: 12-17-1990 MRN: [id number]  Date of admission: 09/21/2018 Delivering MD: Tamera StandsWALLACE, LAUREL S   Date of discharge: 09/24/2018  Admitting diagnosis: pregnancy Intrauterine pregnancy: 4346w6d     Secondary diagnosis:   Principal Problem:   PROM (premature rupture of membranes) Active Problems:   Morbid obesity (HCC)   LGA (large for gestational age) fetus affecting management of mother   Indication for care in labor or delivery   Discharge diagnosis: Term Pregnancy Delivered by Vacuum for Fetal Bradycardia                                            Postpartum procedures: None  Complications: PPH with EBL of about 1L - HgB trended from 11.8 to 9.9, asymptomatic  Outpatient Follow-Up: [x]  desires IUD at postpartum visit   Hospital course: Jenna MaidensKatie K Ojo is a 28 y.o. 3746w6d who was admitted for augmentation of labor for PROM. Her pregnancy was complicated by above noted. Her labor course was notable for augmentation with cytotec and pitocin. Progressed to complete and pushed effectively but fetal bradycardia noted with pushing necessitating outlet vacuum. Delivery was otherwise complicated by 2nd degree perineal and right labial/periurethral lacerations which were repaired. Please see delivery/op note for additional details. Her postpartum course was uncomplicated. She was breastfeeding without difficulty. Baby had significant jaundice and may require bili lights. Pt may room in PRN. By day of discharge, she was passing flatus, urinating, eating and drinking without difficulty. Her pain was well-controlled, and she was discharged home with ibuprofen. She will follow-up in clinic in 4-6 weeks.   Physical exam  Vitals:   09/23/18 0517 09/23/18 1425 09/24/18 0040 09/24/18 0530  BP: 111/73 114/76 (!) 107/57 117/66  Pulse: 69 70 71 66  Resp: 20 19 18 20   Temp: 98.2 F (36.8 C) 98.4 F (36.9 C) 98.3 F (36.8 C)  98.5 F (36.9 C)  TempSrc: Oral Oral Oral Oral  SpO2:      Weight:      Height:       General: well-appearing, NAD Lochia: appropriate Uterine Fundus: firm Incision: N/A DVT Evaluation: Trace calf/ankle edema  Labs: Lab Results  Component Value Date   WBC 17.8 (H) 09/22/2018   HGB 9.7 (L) 09/22/2018   HCT 28.9 (L) 09/22/2018   MCV 80.1 09/22/2018   PLT 267 09/22/2018   CMP Latest Ref Rng & Units 07/06/2018  Glucose 65 - 99 mg/dL 82  BUN 6 - 20 mg/dL 5(L)  Creatinine 4.090.57 - 1.00 mg/dL 8.11(B0.43(L)  Sodium 147134 - 829144 mmol/L 140  Potassium 3.5 - 5.2 mmol/L 4.5  Chloride 96 - 106 mmol/L 102  CO2 20 - 29 mmol/L 21  Calcium 8.7 - 10.2 mg/dL 9.1  Total Protein 6.0 - 8.5 g/dL 6.2  Total Bilirubin 0.0 - 1.2 mg/dL 0.2  Alkaline Phos 39 - 117 IU/L 94  AST 0 - 40 IU/L 16  ALT 0 - 32 IU/L 15    Discharge instructions: Per After Visit Summary and "Baby and Me Booklet"  After visit meds:  Allergies as of 09/24/2018   No Known Allergies     Medication List    STOP taking these medications   aspirin EC 81 MG tablet     TAKE these medications   acetaminophen 500 MG tablet Commonly known as:  TYLENOL Take 1,000 mg by mouth every 6 (six) hours as needed for mild pain.   cholecalciferol 25 MCG (1000 UT) tablet Commonly known as:  VITAMIN D3 Take 1,000 Units by mouth daily.   ibuprofen 800 MG tablet Commonly known as:  ADVIL Take 1 tablet (800 mg total) by mouth 3 (three) times daily.   Magnesium Malate 1250 (141.7 Mg) MG Tabs Take 1 tablet by mouth daily as needed (headache).   omeprazole 40 MG capsule Commonly known as:  PRILOSEC Take 1 capsule (40 mg total) by mouth daily. For heartburn.   PRENATAL VITAMIN PO Take 1 tablet by mouth daily.      Postpartum contraception: IUD Diet: Routine Diet Activity: Advance as tolerated. Pelvic rest for 6 weeks.   Follow-up Appt: Future Appointments  Date Time Provider Department Center  11/03/2018 10:30 AM Federico Flake, MD [facility]  01/24/2019  7:35 AM [facility]  07/26/2019  7:30 AM [facility]  07/29/2019  2:40 PM Doreene Nest, NP [facility]   Please schedule this patient for Postpartum visit in: 4-6 weeks with the following provider: Any provider Low risk pregnancy complicated by: LGA, obesity Delivery mode:  Vacuum Anticipated Birth Control:  IUD PP Procedures needed: none  Schedule Integrated BH visit: no  Newborn Data: Live born female  Birth Weight:  3510 APGAR: 8, 9  Newborn Delivery   Birth date/time:  09/22/2018 03:25:00 Delivery type:  Vaginal, Vacuum (Extractor)    Baby Feeding: Breast Disposition:home with mother  Katrinka Blazing [place] 09/24/2018 10:25 AM

## 2018-09-30 ENCOUNTER — Inpatient Hospital Stay (HOSPITAL_COMMUNITY): Payer: 59

## 2018-10-05 ENCOUNTER — Other Ambulatory Visit: Payer: Self-pay | Admitting: Primary Care

## 2018-10-05 DIAGNOSIS — K219 Gastro-esophageal reflux disease without esophagitis: Secondary | ICD-10-CM

## 2018-10-19 ENCOUNTER — Telehealth: Payer: Self-pay | Admitting: *Deleted

## 2018-10-19 NOTE — Telephone Encounter (Signed)
Pt called wanting to discuss PPD and not sure if she has it or if its just new baby stressers. Pt denies any feelings of harming herself or others, went ahead and scheduled a virtual appt with a provider.

## 2018-10-21 ENCOUNTER — Other Ambulatory Visit: Payer: Self-pay

## 2018-10-21 ENCOUNTER — Telehealth (INDEPENDENT_AMBULATORY_CARE_PROVIDER_SITE_OTHER): Payer: 59 | Admitting: Obstetrics & Gynecology

## 2018-10-21 ENCOUNTER — Encounter: Payer: Self-pay | Admitting: Obstetrics & Gynecology

## 2018-10-21 ENCOUNTER — Telehealth: Payer: 59 | Admitting: Obstetrics & Gynecology

## 2018-10-21 ENCOUNTER — Ambulatory Visit (INDEPENDENT_AMBULATORY_CARE_PROVIDER_SITE_OTHER): Payer: 59 | Admitting: Clinical

## 2018-10-21 DIAGNOSIS — O9270 Unspecified disorders of lactation: Secondary | ICD-10-CM

## 2018-10-21 DIAGNOSIS — F53 Postpartum depression: Secondary | ICD-10-CM

## 2018-10-21 DIAGNOSIS — F4323 Adjustment disorder with mixed anxiety and depressed mood: Secondary | ICD-10-CM | POA: Diagnosis not present

## 2018-10-21 NOTE — BH Specialist Note (Signed)
Integrated Behavioral Health Initial Visit  MRN: [id number] Name: Jenna Bailey  Number of Integrated Behavioral Health Clinician visits:: 1/6 Session Start time: 11:00  Session End time: 11:45 Total time: 45 minutes  Type of Service: Integrated Behavioral Health- Individual/Family Interpretor:No. Interpretor Name and Language: n/a   Warm Hand Off Completed.       SUBJECTIVE: Jenna Bailey is a 28 y.o. female accompanied by n/a Patient was referred by [name], MD for positive depression screen. Patient reports the following symptoms/concerns: Pt states her primary concern today is adjusting to new motherhood, uncertainty about how to calm baby and guilt over milk supply; pt is eating and sleeping well, and feels best when she is able to walk outside.  Duration of problem: Postpartum; Severity of problem: mild  OBJECTIVE: Mood: Normal and Affect: Appropriate Risk of harm to self or others: No plan to harm self or others  LIFE CONTEXT: Family and Social: Pt lives with her husband and newborn daughter School/Work: Maternity leave Self-Care: Implementing daily healthy habits; positive outlook Life Changes: Recent childbirth  GOALS ADDRESSED: Patient will: 1. Reduce symptoms of: anxiety and depression 2. Increase knowledge and/or ability of: coping skills and healthy habits  3. Demonstrate ability to: Increase healthy adjustment to current life circumstances and Increase adequate support systems for patient/family  INTERVENTIONS: Interventions utilized: Mindfulness or Management consultantelaxation Training, Psychoeducation and/or Health Education and Link to [employer]  Standardized Assessments completed: Edinburgh Postnatal Depression  ASSESSMENT: Patient currently experiencing Adjustment disorder with mixed anxious and depressed mood.   Patient may benefit from psychoeducation and brief therapeutic interventions regarding coping with symptoms of depression and anxiety   .  PLAN: 1. Follow up with behavioral health clinician on : One week 2. Behavioral recommendations:  -Talk to Lactation Consultant today -CALM relaxation breathing exercise twice daily for one minute (morning; evening) -Resume taking iron pills, as recommended by medical provider -Go to [email or web address] to register for online Mom Talk and Breastfeeding support; Attend at least one class each -Walk outside every day (at least 5 minutes daily; more if able) -Consider putting water bottles within easy reach throughout the house -Continue sleeping when baby sleeps at night 3. Referral(s): Integrated Art gallery managerBehavioral Health Services (In Clinic) and [employer] Resources:  New mom support 4. "From scale of 1-10, how likely are you to follow plan?": 10  [name], LCSW   Integrated Behavioral Health Visit via Telemedicine (Telephone)  10/21/2018 Jenna Bailey [id number]   Type of Visit: Video [facility] Provider location: [facility] All persons participating in visit: Patient, Si GaulKatie Pennella, and [facility], Jamie McMannes  Confirmed patient's address: Yes  Confirmed patient's phone number: Yes  Any changes to demographics: No   Confirmed patient's insurance: Yes  Any changes to patient's insurance: No   Discussed confidentiality: Yes    The following statements were read to the patient and/or legal guardian that are established with the [facility].  "The purpose of this phone visit is to provide behavioral health care while limiting exposure to the coronavirus (COVID19).  There is a possibility of technology failure and discussed alternative modes of communication if that failure occurs."  "By engaging in this telephone visit, you consent to the provision of healthcare.  Additionally, you authorize for your insurance to be billed for the services provided during this telephone visit."   Patient and/or legal guardian consented to telephone visit: Yes     STRENGTHS (Protective Factors/Coping Skills): Positive outlook, supportive friends and family, sleeping  and eating well postpartum, and daily physical activity

## 2018-10-21 NOTE — Progress Notes (Signed)
I connected with  Jenna Bailey on 10/21/18 at  9:30 AM EDT by telephone and verified that I am speaking with the correct person using two identifiers.   I discussed the limitations, risks, security and privacy concerns of performing an evaluation and management service by telephone and the availability of in person appointments. I also discussed with the patient that there may be a patient responsible charge related to this service. The patient expressed understanding and agreed to proceed.  Scheryl Marten, RN 10/21/2018  9:37 AM

## 2018-10-21 NOTE — Progress Notes (Signed)
[facility]  I connected with Jenna MaidensKatie K Gains on 10/21/18 at  9:30 AM EDT by WebEx at home and verified that I am speaking with the correct person using two identifiers.   I discussed the limitations, risks, security and privacy concerns of performing an evaluation and management service by telephone and the availability of in person appointments. I also discussed with the patient that there may be a patient responsible charge related to this service. The patient expressed understanding and agreed to proceed.   History:  Jenna MaidensKatie K Bailey is a 28 y.o. [id number] female being evaluated today for postpartum depression.  She is s/p VAVD on 09/22/2018.  Uncomplicated postpartum course thus far but reports having problems with breast milk production, baby latching and feels very sad about this.  She is supplementing with formula, but she is depressed about not being able to breastfeed adequately, and overwhelmed a little with being a new mother.  Does not feel she has true postpartum depression, just feels down mostly due to breastfeeding issues. She denies any abnormal vaginal discharge, bleeding, pelvic pain or other concerns.     Edinburgh Postnatal Depression Scale Screening Tool 10/21/2018 09/22/2018  I have been able to laugh and see the funny side of things. 0 0  I have looked forward with enjoyment to things. 0 0  I have blamed myself unnecessarily when things went wrong. 3 1  I have been anxious or worried for no good reason. 1 1  I have felt scared or panicky for no good reason. 1 0  Things have been getting on top of me. 1 1  I have been so unhappy that I have had difficulty sleeping. 0 0  I have felt sad or miserable. 2 1  I have been so unhappy that I have been crying. 1 0  The thought of harming myself has occurred to me. 0 0  Edinburgh Postnatal Depression Scale Total 9 4       Past Medical History:  Diagnosis Date  . Abnormal uterine bleeding  08/25/2016  . Chicken pox   . GERD (gastroesophageal reflux disease)   . Hypertriglyceridemia 03/07/2015  . Morbid obesity (HCC) 06/22/2013  . Recurrent boils 07/26/2014  . Scabies 02/11/2018  . Vitamin D deficiency    Past Surgical History:  Procedure Laterality Date  . NO PAST SURGERIES    . WISDOM TOOTH EXTRACTION     The following portions of the patient's history were reviewed and updated as appropriate: allergies, current medications, past family history, past medical history, past social history, past surgical history and problem list.    Review of Systems:  Pertinent items noted in HPI and remainder of comprehensive ROS otherwise negative.  Physical Exam:   General:  Alert, oriented and cooperative. Patient appears to be in no acute distress.  Mental Status: Normal mood and affect. Normal behavior. Normal judgment and thought content.   Respiratory: Normal respiratory effort, no problems with respiration noted  Rest of physical exam deferred due to type of encounter      Assessment and Plan:     1. Postpartum depression vs situational depression Edinburgh score is 9.  Patient verbally [facility] about presenting concerns. Will set up appointment ASAP. Denies HI/SI, precautions reviewed.  - Amb ref to Integrated Behavioral Health  2. Lactation problem Patient was reassured about her issues, support given to her. Will make appointment with lactation support for her ASAP, also  commended her on reaching out for help. Also attempted to assuage her guilt about not being able to produce adequate breast milk, supplementation is also good and she was told she is doing her best to give her baby the best of both modalities.   - Ambulatory referral to Lactation    I discussed the assessment and treatment plan with the patient. The patient was provided an opportunity to ask questions and all were answered. The patient agreed with the plan and  demonstrated an understanding of the instructions.   The patient was advised to call back or seek an in-person evaluation/go to the ED if the symptoms worsen or if the condition fails to improve as anticipated.  I provided 15 minutes of face-to-face via WebEx time during this encounter.   Jaynie Collins, MD Center for [employer], [facility]

## 2018-10-28 ENCOUNTER — Other Ambulatory Visit: Payer: Self-pay

## 2018-10-28 ENCOUNTER — Ambulatory Visit (INDEPENDENT_AMBULATORY_CARE_PROVIDER_SITE_OTHER): Payer: 59 | Admitting: Clinical

## 2018-10-28 DIAGNOSIS — F4323 Adjustment disorder with mixed anxiety and depressed mood: Secondary | ICD-10-CM | POA: Diagnosis not present

## 2018-10-28 NOTE — BH Specialist Note (Signed)
Integrated Behavioral Health Follow Up Visit  MRN: [id number] Name: Jenna Bailey  Number of Integrated Behavioral Health Clinician visits: 2/6 Session Start time: 10:35  Session End time: 10:48 Total time: 15 minutes  Type of Service: Integrated Behavioral Health- Individual/Family Interpretor:No. Interpretor Name and Language: n/a  SUBJECTIVE: Jenna Bailey is a 28 y.o. female accompanied by Newborn daughter Patient was referred by Jaynie Collins, MD for positive depression screen. Patient reports the following symptoms/concerns: Pt states her primary symptoms today are difficulty relaxing, worry, irritability, fatigue and mild sleep difficulty; pt says she feels she is coping better, and she is "not getting sad anymore". Pt is implementing "Happiest Baby on the Block" technique to calm baby; most concerned today about going back to work on June 1st.  Duration of problem: Postpartum ; Severity of problem: mild  OBJECTIVE: Mood: Normal and Affect: Appropriate Risk of harm to self or others: No plan to harm self or others  LIFE CONTEXT: Family and Social: Pt lives with her husband and newborn daughter School/Work: On maternity leave Self-Care: Implementing daily healthy habits; positive outlook Life Changes: Recent childbirth   GOALS ADDRESSED: Patient will: 1.  Reduce symptoms of: anxiety and depression  2.  Increase knowledge and/or ability of: stress reduction  3.  Demonstrate ability to: Increase healthy adjustment to current life circumstances and Increase adequate support systems for patient/family  INTERVENTIONS: Interventions utilized:  Solution-Focused Strategies and Link to [employer] Standardized Assessments completed: GAD-7 and PHQ 9  ASSESSMENT: Patient currently experiencing Adjustment disorder with mixed anxious and depressed mood.   Patient may benefit from continued brief therapeutic interventions regarding coping with symptoms of depression and  anxiety.  PLAN: 1. Follow up with behavioral health clinician on : As needed 2. Behavioral recommendations:  -Plan a mock "going back to work" day, one day this week, to prepare for a successful first day back on June 1 -Continue using advice from [employer] and Happiest Baby on the [employer] strategies to feed and calm daughter -Continue using self-coping strategies that have proven helpful postpartum  3. Referral(s): Integrated Art gallery manager (In Clinic) and [employer] Resources:  New mom support 4. "From scale of 1-10, how likely are you to follow plan?": 9   C , LCSW

## 2018-11-03 ENCOUNTER — Ambulatory Visit (INDEPENDENT_AMBULATORY_CARE_PROVIDER_SITE_OTHER): Payer: 59 | Admitting: Family Medicine

## 2018-11-03 ENCOUNTER — Ambulatory Visit: Payer: 59 | Admitting: Family Medicine

## 2018-11-03 ENCOUNTER — Encounter: Payer: Self-pay | Admitting: *Deleted

## 2018-11-03 ENCOUNTER — Encounter: Payer: Self-pay | Admitting: Family Medicine

## 2018-11-03 ENCOUNTER — Other Ambulatory Visit: Payer: Self-pay

## 2018-11-03 DIAGNOSIS — Z30430 Encounter for insertion of intrauterine contraceptive device: Secondary | ICD-10-CM

## 2018-11-03 MED ORDER — LEVONORGESTREL 19.5 MCG/DAY IU IUD
INTRAUTERINE_SYSTEM | Freq: Once | INTRAUTERINE | Status: AC
  Administered 2018-11-03: 09:00:00 via INTRAUTERINE

## 2018-11-03 NOTE — Progress Notes (Signed)
Subjective:     Jenna Bailey is a 28 y.o. female who presents for a postpartum visit. She is 5 weeks postpartum following a VAVD. I have fully reviewed the prenatal and intrapartum course. The delivery was at 56w6dby VAVD. Outcome: vaccum. Anesthesia: local. Postpartum course has been complicated by 2nd degree and depression/mood due to breastfeeding . Baby's course has been uncomplicated. Baby is feeding by Enfamil neuro pro . Bleeding no bleeding. Bowel function is normal. Bladder function is normal. Patient is not sexually active. Contraception method is IUD. Postpartum depression screening: negative.  The following portions of the patient's history were reviewed and updated as appropriate: allergies, current medications, past family history, past medical history, past social history, past surgical history and problem list.  Review of Systems Pertinent items are noted in HPI.   Objective:    LMP 12/17/2017 (Exact Date)   General:  alert, cooperative and appears stated age   Breasts:   not examined  Lungs: clear to auscultation bilaterally  Heart:  regular rate and rhythm, S1, S2 normal, no murmur, click, rub or gallop  Abdomen: soft, non-tender; bowel sounds normal; no masses,  no organomegaly   Vulva:  normal  Vagina: normal vagina  Cervix:  multiparous appearance  Corpus: normal  Adnexa:  normal adnexa  Rectal Exam: Not performed.         IUD Insertion Procedure Note Patient identified, informed consent performed, consent signed.   Discussed risks of irregular bleeding, cramping, infection, malpositioning or misplacement of the IUD outside the uterus which may require further procedure such as laparoscopy. Time out was performed.  Urine pregnancy test negative.  Speculum placed in the vagina.  Cervix visualized.  Cleaned with Betadine x 2.  Grasped anteriorly with a single tooth tenaculum.  Uterus sounded to 8 cm.  IUD placed per manufacturer's recommendations.  Strings trimmed to  3 cm. Tenaculum was removed, good hemostasis noted.  Patient tolerated procedure well.   Patient was given post-procedure instructions.  She was advised to have backup contraception for one week.  Patient was also asked to check IUD strings periodically and follow up in 4 weeks for IUD check.  Assessment:     normal postpartum exam. Pap smear not done at today's visit.   Plan:    1. Contraception: LNG- IUD- instructed on how to check strings and need to come in for visualization prn 2.  Mood- negative screening today. Provided support on breastfeeding goals not being met. Has had appt with BH. 3. Follow up in: 1 yr  or as needed.

## 2018-11-29 ENCOUNTER — Telehealth: Payer: Self-pay | Admitting: *Deleted

## 2018-11-29 NOTE — Telephone Encounter (Signed)
returned pt call and explained that abnormal bleeding is normal within the first 6 months of getting an IUD. If bleeding becomes worse told pt to call office back. Pt verbalizes and understands and just wanted to make sure that this was normal.

## 2018-11-29 NOTE — Telephone Encounter (Signed)
-----   Message from Allena Earing, NT sent at 11/29/2018 11:51 AM EDT ----- Regarding: requesting a call back Patient states that she got IUD in May states that she has been bleeding for 2 weeks, heavy in am and lighter in the evening. Would like a call to advise  what to do

## 2019-01-24 ENCOUNTER — Other Ambulatory Visit: Payer: 59

## 2019-02-18 IMAGING — US US MFM OB FOLLOW-UP
1 series · 14 of 28 positions shown · non-contrast
Comparison: none

[Series 1: us mfm ob follow-up · 69 acquisitions, 14 frames shown]
[im 3/69]
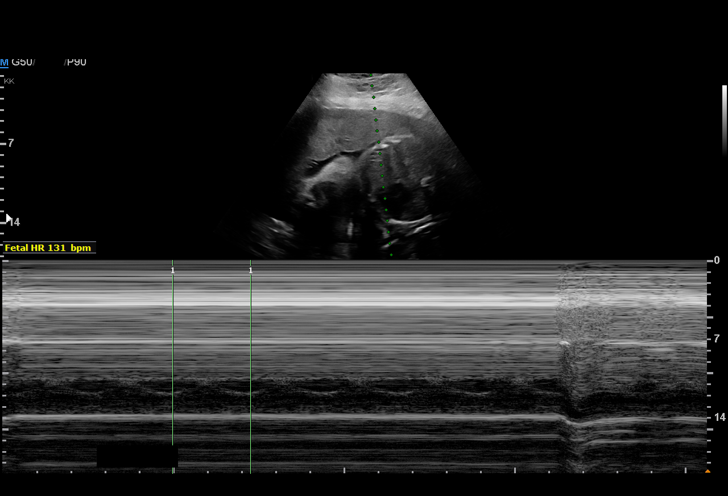
[im 8/69]
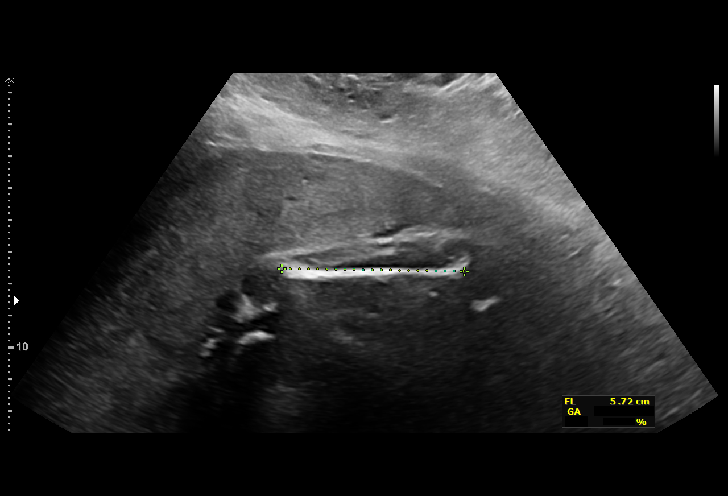
[im 13/69]
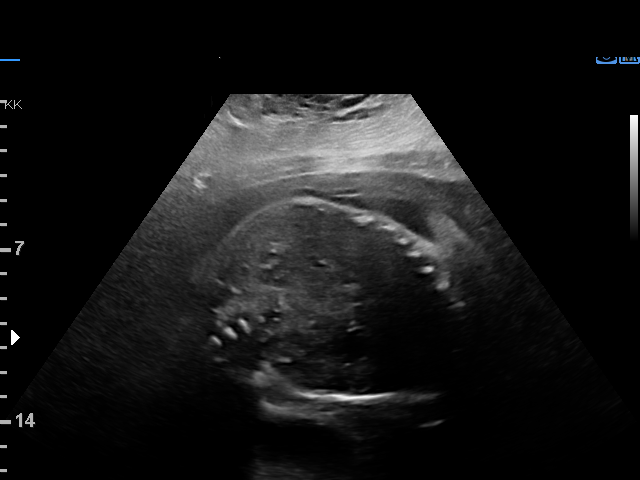
[im 18/69]
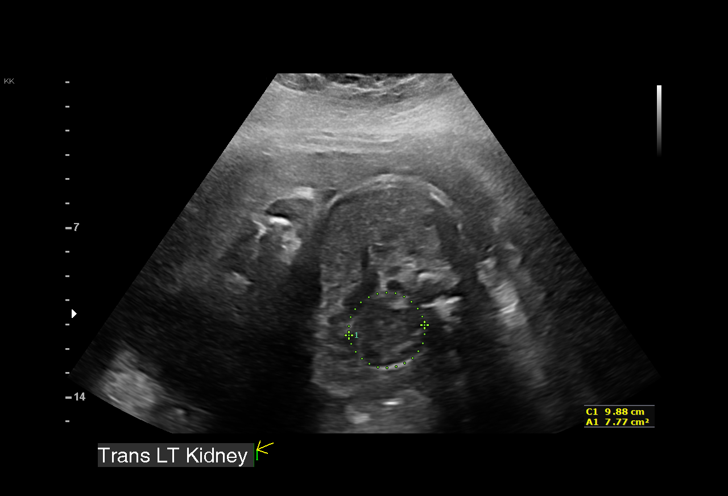
[im 23/69]
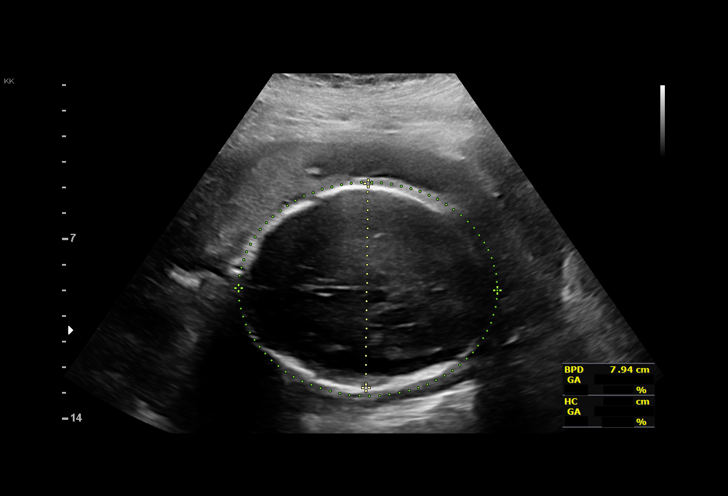
[im 28/69]
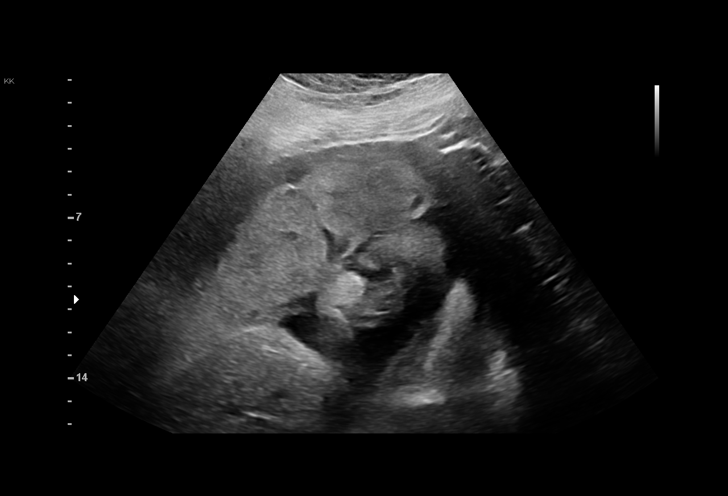
[im 33/69]
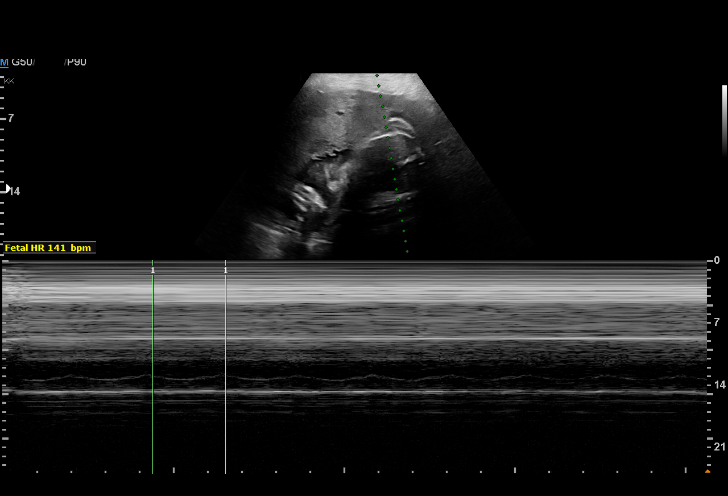
[im 38/69]
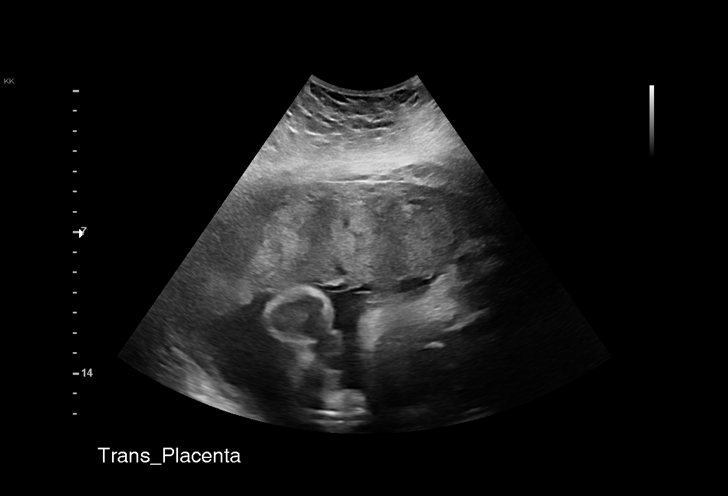
[im 43/69]
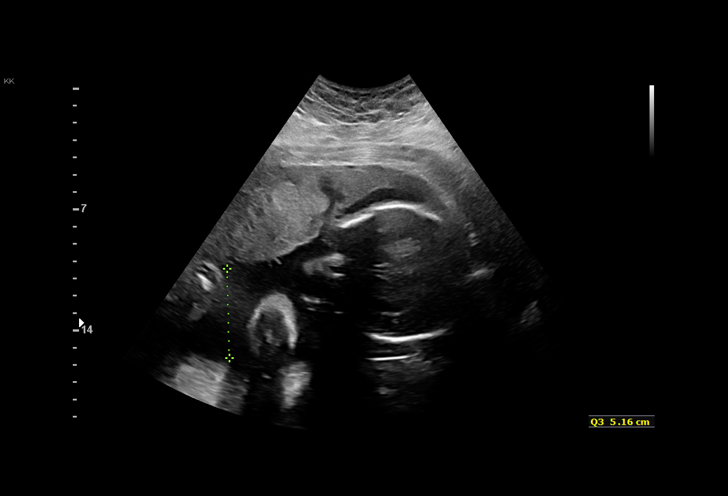
[im 48/69]
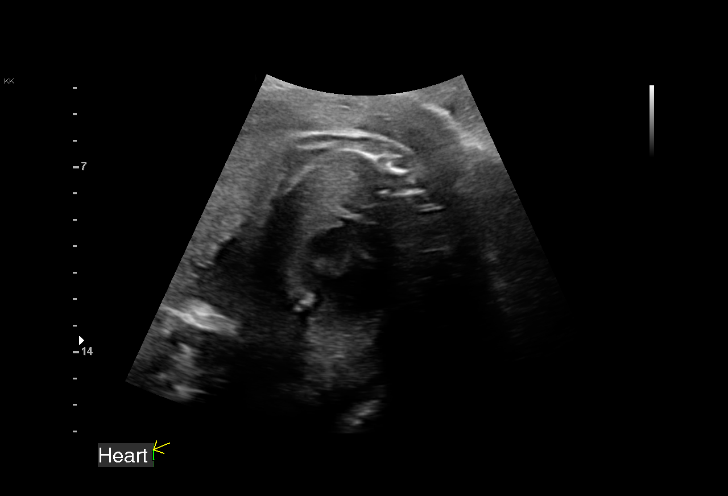
[im 53/69]
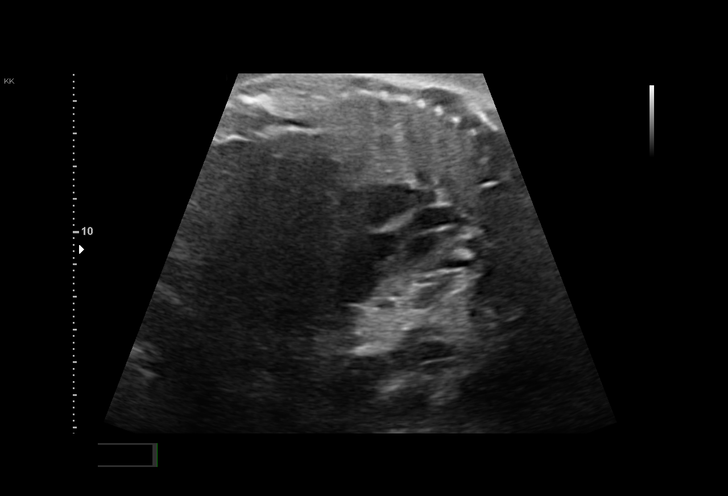
[im 58/69]
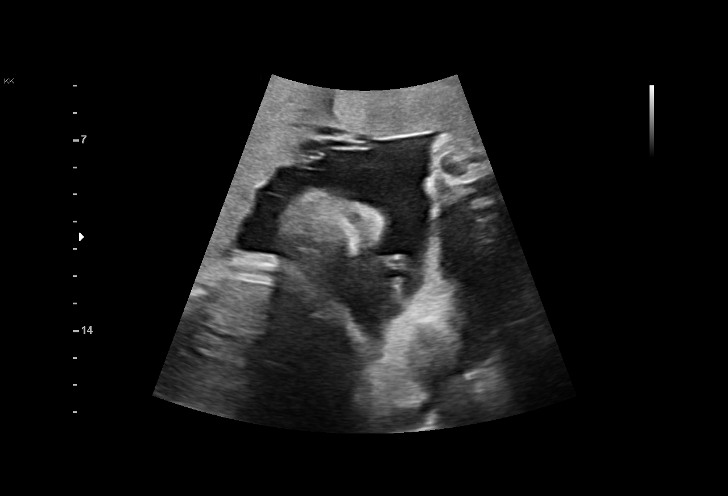
[im 63/69]
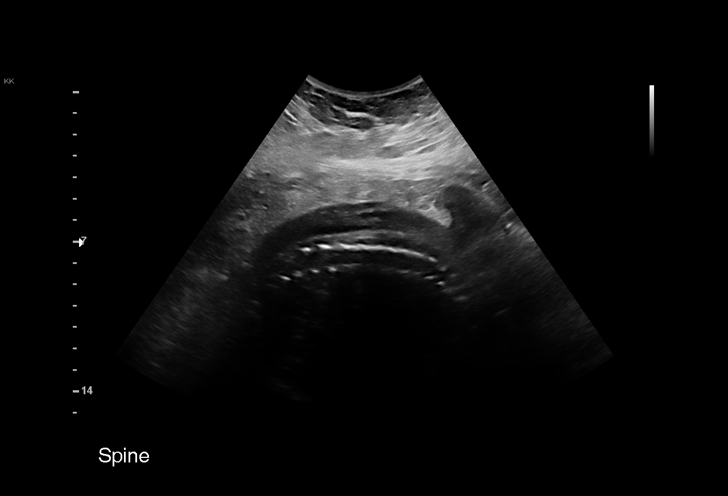
[im 69/69]
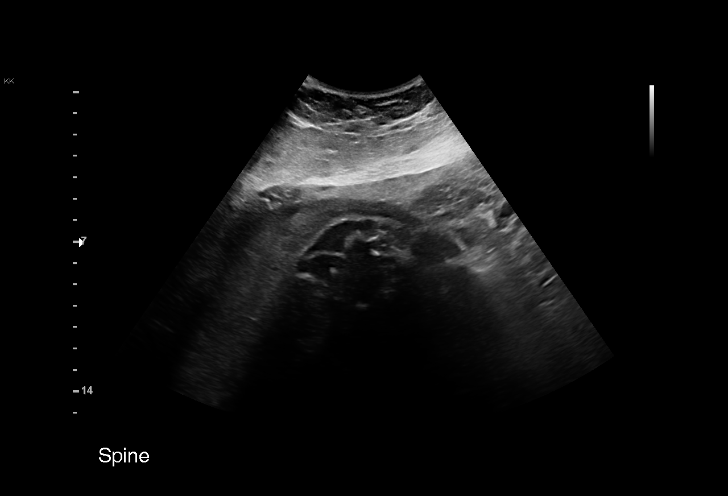

[14 of 28 positions shown; findings below may reference images not displayed]

----------------------------------------------------------------------

 ----------------------------------------------------------------------
Indications

  Maternal morbid obesity
  Encounter for other antenatal screening
  follow-up
  30 weeks gestation of pregnancy
 ----------------------------------------------------------------------
Vital Signs

 BMI:
Fetal Evaluation

 Num Of Fetuses:          1
 Fetal Heart Rate(bpm):   131
 Cardiac Activity:        Observed
 Presentation:            Cephalic
 Placenta:                Anterior
 P. Cord Insertion:       Previously Visualized

 Amniotic Fluid
 AFI FV:      Within normal limits

 AFI Sum(cm)     %Tile       Largest Pocket(cm)
 16.43           60

 RUQ(cm)       RLQ(cm)       LUQ(cm)        LLQ(cm)

Biometry
 BPD:      79.5  mm     G. Age:  31w 6d         90  %    CI:        77.01   %    70 - 86
                                                         FL/HC:       20.0  %    19.2 -
 HC:      286.9  mm     G. Age:  31w 3d         59  %    HC/AC:       1.00       0.99 -
 AC:      285.6  mm     G. Age:  32w 4d       > 97  %    FL/BPD:      72.3  %    71 - 87
 FL:       57.5  mm     G. Age:  30w 1d         39  %    FL/AC:       20.1  %    20 - 24

 Est. FW:    9798   gm          4 lb     79  %
OB History

 Gravidity:    1         Term:   0        Prem:   0        SAB:   0
 TOP:          0       Ectopic:  0        Living: 0
Gestational Age

 LMP:           30w 0d        Date:  12/17/17                 EDD:   09/23/18
 U/S Today:     31w 4d                                        EDD:   09/12/18
 Best:          30w 0d     Det. By:  LMP  (12/17/17)          EDD:   09/23/18
Anatomy

 Cranium:               Appears normal         Aortic Arch:            Previously seen
 Cavum:                 Previously seen        Ductal Arch:            Not well visualized
 Ventricles:            Appears normal         Diaphragm:              Not well visualized
 Choroid Plexus:        Previously seen        Stomach:                Appears normal, left
                                                                       sided
 Cerebellum:            Previously seen        Abdomen:                Appears normal
 Posterior Fossa:       Previously seen        Abdominal Wall:         Not well visualized
 Nuchal Fold:           Previously seen        Cord Vessels:           Previously seen
 Face:                  Orbits and profile     Kidneys:                Appear normal
                        previously seen
 Lips:                  Appears normal         Bladder:                Appears normal
 Thoracic:              Previously seen        Spine:                  Limited views
                                                                       appear normal
 Heart:                 Appears normal         Upper Extremities:      Previously seen
                        (4CH, axis, and situs
 RVOT:                  Appears normal         Lower Extremities:      Previously seen
 LVOT:                  Appears normal

 Other:  Technically difficult due to maternal habitus and fetal position.
Cervix Uterus Adnexa

 Cervix
 Not visualized (advanced GA >18wks)
Impression

 Normal interval growth.
Recommendations

 Repeat growth in 4 weeks given BMI 40

## 2019-05-06 ENCOUNTER — Other Ambulatory Visit: Payer: Self-pay | Admitting: Primary Care

## 2019-05-06 DIAGNOSIS — K219 Gastro-esophageal reflux disease without esophagitis: Secondary | ICD-10-CM

## 2019-07-18 ENCOUNTER — Other Ambulatory Visit: Payer: Self-pay | Admitting: Primary Care

## 2019-07-18 DIAGNOSIS — E781 Pure hyperglyceridemia: Secondary | ICD-10-CM

## 2019-07-26 ENCOUNTER — Other Ambulatory Visit: Payer: 59

## 2019-07-29 ENCOUNTER — Encounter: Payer: 59 | Admitting: Primary Care

## 2019-10-05 ENCOUNTER — Other Ambulatory Visit (INDEPENDENT_AMBULATORY_CARE_PROVIDER_SITE_OTHER): Payer: 59

## 2019-10-05 ENCOUNTER — Other Ambulatory Visit: Payer: Self-pay

## 2019-10-05 DIAGNOSIS — E781 Pure hyperglyceridemia: Secondary | ICD-10-CM | POA: Diagnosis not present

## 2019-10-05 LAB — COMPREHENSIVE METABOLIC PANEL
ALT: 29 U/L (ref 0–35)
AST: 29 U/L (ref 0–37)
Albumin: 4.2 g/dL (ref 3.5–5.2)
Alkaline Phosphatase: 75 U/L (ref 39–117)
BUN: 8 mg/dL (ref 6–23)
CO2: 30 mEq/L (ref 19–32)
Calcium: 9.2 mg/dL (ref 8.4–10.5)
Chloride: 102 mEq/L (ref 96–112)
Creatinine, Ser: 0.61 mg/dL (ref 0.40–1.20)
GFR: 115.09 mL/min (ref >60.00)
Glucose, Bld: 103 mg/dL — ABNORMAL HIGH (ref 70–99)
Potassium: 4.4 mEq/L (ref 3.5–5.1)
Sodium: 138 mEq/L (ref 135–145)
Total Bilirubin: 0.4 mg/dL (ref 0.2–1.2)
Total Protein: 6.8 g/dL (ref 6.0–8.3)

## 2019-10-05 LAB — LIPID PANEL
Cholesterol: 153 mg/dL (ref 0–200)
HDL: 30.4 mg/dL — ABNORMAL LOW (ref >39.00)
LDL Cholesterol: 87 mg/dL (ref 0–99)
NonHDL: 122.36
Total CHOL/HDL Ratio: 5
Triglycerides: 176 mg/dL — ABNORMAL HIGH (ref 0.0–149.0)
VLDL: 35.2 mg/dL (ref 0.0–40.0)

## 2019-10-06 LAB — CBC
HCT: 36.5 % (ref 36.0–46.0)
Hemoglobin: 11.7 g/dL — ABNORMAL LOW (ref 12.0–15.0)
MCHC: 32 g/dL (ref 30.0–36.0)
MCV: 76.5 fl — ABNORMAL LOW (ref 78.0–100.0)
Platelets: 340 10*3/uL (ref 150.0–400.0)
RBC: 4.77 Mil/uL (ref 3.87–5.11)
RDW: 16.4 % — ABNORMAL HIGH (ref 11.5–15.5)
WBC: 7.7 10*3/uL (ref 4.0–10.5)

## 2019-10-11 ENCOUNTER — Other Ambulatory Visit: Payer: Self-pay

## 2019-10-11 ENCOUNTER — Encounter: Payer: Self-pay | Admitting: Primary Care

## 2019-10-11 ENCOUNTER — Ambulatory Visit (INDEPENDENT_AMBULATORY_CARE_PROVIDER_SITE_OTHER): Payer: 59 | Admitting: Primary Care

## 2019-10-11 VITALS — BP 120/82 | HR 94 | Temp 96.4°F | Ht 66.5 in | Wt 308.5 lb

## 2019-10-11 DIAGNOSIS — D649 Anemia, unspecified: Secondary | ICD-10-CM

## 2019-10-11 DIAGNOSIS — Z0000 Encounter for general adult medical examination without abnormal findings: Secondary | ICD-10-CM

## 2019-10-11 DIAGNOSIS — R519 Headache, unspecified: Secondary | ICD-10-CM

## 2019-10-11 DIAGNOSIS — F411 Generalized anxiety disorder: Secondary | ICD-10-CM

## 2019-10-11 DIAGNOSIS — E781 Pure hyperglyceridemia: Secondary | ICD-10-CM

## 2019-10-11 DIAGNOSIS — K219 Gastro-esophageal reflux disease without esophagitis: Secondary | ICD-10-CM

## 2019-10-11 MED ORDER — PROPRANOLOL HCL ER 80 MG PO CP24: 80.0000 mg | ORAL_CAPSULE | Freq: Every day | ORAL | 0 refills | Status: DC

## 2019-10-11 NOTE — Progress Notes (Signed)
Subjective:    Patient ID: Jenna Bailey, female    DOB: 1991-04-10, 29 y.o.   MRN: [id number]  HPI  This visit occurred during the SARS-CoV-2 public health emergency.  Safety protocols were in place, including screening questions prior to the visit, additional usage of staff PPE, and extensive cleaning of exam room while observing appropriate contact time as indicated for disinfecting solutions.   Jenna Bailey is a 29 year old female who presents today for complete physical. She would also like to discuss anxiety.   Intermittent symptoms of palpitations, chest tightness, feeling anxious panic attacks, worry that began 6+ months ago. She's had these symptoms occur in 2010 which improved after coming off of birth control. She had her IUD placed around June 2020.  GAD 7 score of 12 today.   Chronic, daily headaches that are located to the bilateral frontal lobes. She does on occasion have nausea and photophobia. She was on magnesium daily for headaches during pregnancy.   Immunizations: -Tetanus: Completed in 2020 -Influenza: Completed last season -Covid-19: Completed in April 2021  Diet: She endorses a healthy diet when able.  Exercise: She is not exercising.   Eye exam: Completed in 2020 Dental exam: Scheduled.   Pap Smear: Completed in 2019  BP Readings from Last 3 Encounters:  10/11/19 120/82  11/03/18 123/84  09/24/18 117/66     Review of Systems  Constitutional: Negative for unexpected weight change.  HENT: Negative for rhinorrhea.   Respiratory: Negative for cough and shortness of breath.   Cardiovascular: Negative for chest pain.  Gastrointestinal: Negative for constipation and diarrhea.  Genitourinary: Negative for difficulty urinating.  Musculoskeletal: Negative for arthralgias and myalgias.  Skin: Negative for rash.  Allergic/Immunologic: Negative for environmental allergies.  Neurological: Positive for headaches. Negative for dizziness.  Psychiatric/Behavioral:  The patient is nervous/anxious.        See HPI       Past Medical History:  Diagnosis Date  . Abnormal uterine bleeding 08/25/2016  . Chicken pox   . GERD (gastroesophageal reflux disease)   . Hypertriglyceridemia 03/07/2015  . Morbid obesity (HCC) 06/22/2013  . Recurrent boils 07/26/2014  . Scabies 02/11/2018  . Vitamin D deficiency      Social History   Socioeconomic History  . Marital status: Married    Spouse name: Not on file  . Number of children: Not on file  . Years of education: Not on file  . Highest education level: Not on file  Occupational History  . Not on file  Tobacco Use  . Smoking status: Never Smoker  . Smokeless tobacco: Never Used  Substance and Sexual Activity  . Alcohol use: No  . Drug use: No  . Sexual activity: Yes    Birth control/protection: None  Other Topics Concern  . Not on file  Social History Narrative   Married.   No children, undergoing fertility treatments.   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   Enjoys reading, traveling, watching TV.   Social Determinants of Health   Financial Resource Strain:   . Difficulty of Paying Living Expenses:   Food Insecurity:   . Worried About Programme researcher, broadcasting/film/video in the Last Year:   . Barista in the Last Year:   Transportation Needs:   . Freight forwarder (Medical):   Marland Kitchen Lack of Transportation (Non-Medical):   Physical Activity:   . Days of Exercise per Week:   . Minutes of Exercise per Session:  Stress:   . Feeling of Stress :   Social Connections:   . Frequency of Communication with Friends and Family:   . Frequency of Social Gatherings with Friends and Family:   . Attends Religious Services:   . Active Member of Clubs or Organizations:   . Attends Archivist Meetings:   Marland Kitchen Marital Status:   Intimate Partner Violence:   . Fear of Current or Ex-Partner:   . Emotionally Abused:   Marland Kitchen Physically Abused:   . Sexually Abused:     Past Surgical History:  Procedure  Laterality Date  . NO PAST SURGERIES    . WISDOM TOOTH EXTRACTION      Family History  Problem Relation Age of Onset  . Diabetes Paternal Grandmother   . Depression Father   . Alcohol abuse Father   . Hypertension Mother     No Known Allergies  Current Outpatient Medications on File Prior to Visit  Medication Sig Dispense Refill  . acetaminophen (TYLENOL) 500 MG tablet Take 1,000 mg by mouth every 6 (six) hours as needed for mild pain.    Marland Kitchen omeprazole (PRILOSEC) 40 MG capsule TAKE 1 CAPSULE BY MOUTH EVERY DAY 90 capsule 1  . ferrous sulfate 325 (65 FE) MG EC tablet Take 325 mg by mouth 3 (three) times daily with meals.     No current facility-administered medications on file prior to visit.    BP 120/82   Pulse 94   Temp (!) 96.4 F (35.8 C) (Temporal)   Ht 5' 6.5" (1.689 m)   Wt (!) 308 lb 8 oz (139.9 kg)   SpO2 98%   Breastfeeding No   BMI 49.05 kg/m    Objective:   Physical Exam  Constitutional: She is oriented to person, place, and time. She appears well-nourished.  HENT:  Right Ear: Tympanic membrane and ear canal normal.  Left Ear: Tympanic membrane and ear canal normal.  Mouth/Throat: Oropharynx is clear and moist.  Eyes: Pupils are equal, round, and reactive to light. EOM are normal.  Cardiovascular: Normal rate and regular rhythm.  Respiratory: Effort normal and breath sounds normal.  GI: Soft. Bowel sounds are normal. There is no abdominal tenderness.  Musculoskeletal:        General: Normal range of motion.     Cervical back: Neck supple.  Neurological: She is alert and oriented to person, place, and time. No cranial nerve deficit.  Reflex Scores:      Patellar reflexes are 2+ on the right side and 2+ on the left side. Skin: Skin is warm and dry.  Psychiatric: She has a normal mood and affect.           Assessment & Plan:

## 2019-10-11 NOTE — Assessment & Plan Note (Signed)
Slightly above goal, discussed to work on diet and exercise.

## 2019-10-11 NOTE — Assessment & Plan Note (Signed)
Intermittent, more consistent over last several months. GAd 7 score of 12 today.   Discussed options for treatment, she kindly declines therapy. Will start with low dose propranolol for headache prevention to see if this helps to control anxiety.  She will update in a few weeks. Consider low dose Zoloft if no improvement.

## 2019-10-11 NOTE — Assessment & Plan Note (Signed)
Mild, appears to be iron deficiency. She will resume ferrous sulfate several times weekly.

## 2019-10-11 NOTE — Assessment & Plan Note (Signed)
Chronic and daily, taking Tylenol daily. Will trial low dose Propranolol ER 80 mg daily for headache prevention. Patient will update.

## 2019-10-11 NOTE — Assessment & Plan Note (Signed)
Doing well on omeprazole 40 mg, could not tolerate lower dose. Continue same.

## 2019-10-11 NOTE — Assessment & Plan Note (Signed)
Encouraged patient to work on diet and exercise   

## 2019-10-11 NOTE — Patient Instructions (Signed)
Start exercising. You should be getting 150 minutes of moderate intensity exercise weekly.  It's important to improve your diet by reducing consumption of fast food, fried food, processed snack foods, sugary drinks. Increase consumption of fresh vegetables and fruits, whole grains, water.  Ensure you are drinking 64 ounces of water daily.  Start propranolol ER 80 mg once daily for headache prevention and anxiety. Please update me in a few weeks.  It was a pleasure to see you today!   Preventive Care 44-38 Years Old, Female Preventive care refers to visits with your health care provider and lifestyle choices that can promote health and wellness. This includes:  A yearly physical exam. This may also be called an annual well check.  Regular dental visits and eye exams.  Immunizations.  Screening for certain conditions.  Healthy lifestyle choices, such as eating a healthy diet, getting regular exercise, not using drugs or products that contain nicotine and tobacco, and limiting alcohol use. What can I expect for my preventive care visit? Physical exam Your health care provider will check your:  Height and weight. This may be used to calculate body mass index (BMI), which tells if you are at a healthy weight.  Heart rate and blood pressure.  Skin for abnormal spots. Counseling Your health care provider may ask you questions about your:  Alcohol, tobacco, and drug use.  Emotional well-being.  Home and relationship well-being.  Sexual activity.  Eating habits.  Work and work Statistician.  Method of birth control.  Menstrual cycle.  Pregnancy history. What immunizations do I need?  Influenza (flu) vaccine  This is recommended every year. Tetanus, diphtheria, and pertussis (Tdap) vaccine  You may need a Td booster every 10 years. Varicella (chickenpox) vaccine  You may need this if you have not been vaccinated. Human papillomavirus (HPV) vaccine  If recommended  by your health care provider, you may need three doses over 6 months. Measles, mumps, and rubella (MMR) vaccine  You may need at least one dose of MMR. You may also need a second dose. Meningococcal conjugate (MenACWY) vaccine  One dose is recommended if you are age 47-21 years and a first-year college student living in a residence hall, or if you have one of several medical conditions. You may also need additional booster doses. Pneumococcal conjugate (PCV13) vaccine  You may need this if you have certain conditions and were not previously vaccinated. Pneumococcal polysaccharide (PPSV23) vaccine  You may need one or two doses if you smoke cigarettes or if you have certain conditions. Hepatitis A vaccine  You may need this if you have certain conditions or if you travel or work in places where you may be exposed to hepatitis A. Hepatitis B vaccine  You may need this if you have certain conditions or if you travel or work in places where you may be exposed to hepatitis B. Haemophilus influenzae type b (Hib) vaccine  You may need this if you have certain conditions. You may receive vaccines as individual doses or as more than one vaccine together in one shot (combination vaccines). Talk with your health care provider about the risks and benefits of combination vaccines. What tests do I need?  Blood tests  Lipid and cholesterol levels. These may be checked every 5 years starting at age 63.  Hepatitis C test.  Hepatitis B test. Screening  Diabetes screening. This is done by checking your blood sugar (glucose) after you have not eaten for a while (fasting).  Sexually transmitted  disease (STD) testing.  BRCA-related cancer screening. This may be done if you have a family history of breast, ovarian, tubal, or peritoneal cancers.  Pelvic exam and Pap test. This may be done every 3 years starting at age 74. Starting at age 31, this may be done every 5 years if you have a Pap test in  combination with an HPV test. Talk with your health care provider about your test results, treatment options, and if necessary, the need for more tests. Follow these instructions at home: Eating and drinking   Eat a diet that includes fresh fruits and vegetables, whole grains, lean protein, and low-fat dairy.  Take vitamin and mineral supplements as recommended by your health care provider.  Do not drink alcohol if: ? Your health care provider tells you not to drink. ? You are pregnant, may be pregnant, or are planning to become pregnant.  If you drink alcohol: ? Limit how much you have to 0-1 drink a day. ? Be aware of how much alcohol is in your drink. In the [place] one drink equals one 12 oz bottle of beer (355 mL), one 5 oz glass of wine (148 mL), or one 1 oz glass of hard liquor (44 mL). Lifestyle  Take daily care of your teeth and gums.  Stay active. Exercise for at least 30 minutes on 5 or more days each week.  Do not use any products that contain nicotine or tobacco, such as cigarettes, e-cigarettes, and chewing tobacco. If you need help quitting, ask your health care provider.  If you are sexually active, practice safe sex. Use a condom or other form of birth control (contraception) in order to prevent pregnancy and STIs (sexually transmitted infections). If you plan to become pregnant, see your health care provider for a preconception visit. What's next?  Visit your health care provider once a year for a well check visit.  Ask your health care provider how often you should have your eyes and teeth checked.  Stay up to date on all vaccines. This information is not intended to replace advice given to you by your health care provider. Make sure you discuss any questions you have with your health care provider. Document Revised: 02/04/2018 Document Reviewed: 02/04/2018 Elsevier Patient Education  2020 [employer].

## 2019-10-11 NOTE — Assessment & Plan Note (Signed)
Immunizations UTD. Pap smear UTD. Discussed the importance of a healthy diet and regular exercise in order for weight loss, and to reduce the risk of any potential medical problems.  Exam today unremarkable. Labs reviewed.

## 2019-11-07 ENCOUNTER — Telehealth: Payer: Self-pay | Admitting: Primary Care

## 2019-11-07 DIAGNOSIS — R519 Headache, unspecified: Secondary | ICD-10-CM

## 2019-11-07 DIAGNOSIS — K219 Gastro-esophageal reflux disease without esophagitis: Secondary | ICD-10-CM

## 2019-11-09 NOTE — Telephone Encounter (Signed)
Last prescribed on 10/11/2019 . Last OV on 10/11/2019. No future OV scheduled

## 2020-01-10 DIAGNOSIS — R519 Headache, unspecified: Secondary | ICD-10-CM

## 2020-01-11 MED ORDER — TOPIRAMATE 50 MG PO TABS: 50.0000 mg | ORAL_TABLET | Freq: Every day | ORAL | 0 refills | Status: DC

## 2020-02-07 ENCOUNTER — Other Ambulatory Visit: Payer: Self-pay | Admitting: Primary Care

## 2020-02-07 DIAGNOSIS — R519 Headache, unspecified: Secondary | ICD-10-CM

## 2020-02-07 NOTE — Telephone Encounter (Signed)
See my chart message

## 2020-02-07 NOTE — Telephone Encounter (Signed)
Topamax refill requst.  Last filled 01/11/2020.  Last seen 10/11/2019.

## 2020-04-12 ENCOUNTER — Other Ambulatory Visit: Payer: Self-pay | Admitting: Primary Care

## 2020-04-12 DIAGNOSIS — K219 Gastro-esophageal reflux disease without esophagitis: Secondary | ICD-10-CM

## 2020-05-07 ENCOUNTER — Telehealth (INDEPENDENT_AMBULATORY_CARE_PROVIDER_SITE_OTHER): Payer: 59 | Admitting: Primary Care

## 2020-05-07 ENCOUNTER — Encounter: Payer: Self-pay | Admitting: Primary Care

## 2020-05-07 VITALS — Ht 66.5 in | Wt 311.0 lb

## 2020-05-07 DIAGNOSIS — J019 Acute sinusitis, unspecified: Secondary | ICD-10-CM | POA: Insufficient documentation

## 2020-05-07 MED ORDER — AZITHROMYCIN 250 MG PO TABS: ORAL_TABLET | ORAL | 0 refills | Status: DC

## 2020-05-07 MED ORDER — BENZONATATE 200 MG PO CAPS: 200.0000 mg | ORAL_CAPSULE | Freq: Three times a day (TID) | ORAL | 0 refills | Status: DC | PRN

## 2020-05-07 NOTE — Patient Instructions (Signed)
Start Azithromycin antibiotics for infection. Take 2 tablets by mouth today, then 1 tablet daily for 4 additional days.  You may take Benzonatate capsules for cough. Take 1 capsule by mouth three times daily as needed for cough.  Try using Flonase nasal spray. Do not use Afrin any longer.  It was a pleasure to see you today! Mayra Reel, NP-C

## 2020-05-07 NOTE — Assessment & Plan Note (Signed)
One week history of sinus pressure, now with worsening cough which could be secondary to nasal drainage.   No improvement with OTC treatment. Given duration of symptoms without improvement, will treat for presumed infection.  Rx for Zpak and Jerilynn Som sent to pharmacy. She will update.

## 2020-05-07 NOTE — Progress Notes (Signed)
Subjective:    Patient ID: Jenna Bailey, female    DOB: [birth year], 29 y.o.   MRN: [id number]  HPI  Virtual Visit via Video Note  I connected with Jenna Bailey on 05/07/20 at  9:40 AM EST by a video enabled telemedicine application and verified that I am speaking with the correct person using two identifiers.  Location: Patient: Home Provider: Office Participants: Patient and myself    I discussed the limitations of evaluation and management by telemedicine and the availability of in person appointments. The patient expressed understanding and agreed to proceed.  History of Present Illness:  Jenna Bailey is a 29 year old female with a history of GERD, anemia, frequent headaches who presents today with a chief complaint of sinus pressure.  Symptoms began one week ago with symptoms of cough, nasal burning and congestion, thick/green nasal mucous, frontal headache with pressure.   She's been taking Alka-Seltzer, Mucinex Sinus, Afrin nasal spray without improvement. Today she's feeling about the same overall except that her cough is getting worse. Her daughter is now experiencing a milder version of these symptoms.   She completed a Covid-19 test last week which was negative, she's also been vaccinated against Covid-19.    Observations/Objective:  Alert and oriented. Appears well, not sickly. No distress. Speaking in complete sentences. No cough during visit.  Assessment and Plan:  One week history of sinus pressure, now with worsening cough which could be secondary to nasal drainage.   No improvement with OTC treatment. Given duration of symptoms without improvement, will treat for presumed infection.  Rx for Zpak and Jerilynn Som sent to pharmacy. She will update.   Follow Up Instructions:  Start Azithromycin antibiotics for infection. Take 2 tablets by mouth today, then 1 tablet daily for 4 additional days.  You may take Benzonatate capsules for cough. Take 1  capsule by mouth three times daily as needed for cough.  Try using Flonase nasal spray. Do not use Afrin any longer.  It was a pleasure to see you today! Jenna Reel, NP-C    I discussed the assessment and treatment plan with the patient. The patient was provided an opportunity to ask questions and all were answered. The patient agreed with the plan and demonstrated an understanding of the instructions.   The patient was advised to call back or seek an in-person evaluation if the symptoms worsen or if the condition fails to improve as anticipated.    Doreene Nest, NP    Review of Systems  Constitutional: Positive for fatigue. Negative for fever.  HENT: Positive for congestion, sinus pressure and sinus pain. Negative for ear pain.   Respiratory: Positive for cough. Negative for shortness of breath.   Cardiovascular: Negative for chest pain.  Allergic/Immunologic: Positive for environmental allergies.       Past Medical History:  Diagnosis Date  . Abnormal uterine bleeding 08/25/2016  . Chicken pox   . GERD (gastroesophageal reflux disease)   . Hypertriglyceridemia 03/07/2015  . Morbid obesity (HCC) 06/22/2013  . Recurrent boils 07/26/2014  . Scabies 02/11/2018  . Vitamin D deficiency      Social History   Socioeconomic History  . Marital status: Married    Spouse name: Not on file  . Number of children: Not on file  . Years of education: Not on file  . Highest education level: Not on file  Occupational History  . Not on file  Tobacco Use  . Smoking status: Never Smoker  .  Smokeless tobacco: Never Used  Vaping Use  . Vaping Use: Never used  Substance and Sexual Activity  . Alcohol use: No  . Drug use: No  . Sexual activity: Yes    Birth control/protection: None  Other Topics Concern  . Not on file  Social History Narrative   Married.   No children, undergoing fertility treatments.   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   Enjoys reading, traveling,  watching TV.   Social Determinants of Health   Financial Resource Strain:   . Difficulty of Paying Living Expenses: Not on file  Food Insecurity:   . Worried About Programme researcher, broadcasting/film/video in the Last Year: Not on file  . Ran Out of Food in the Last Year: Not on file  Transportation Needs:   . Lack of Transportation (Medical): Not on file  . Lack of Transportation (Non-Medical): Not on file  Physical Activity:   . Days of Exercise per Week: Not on file  . Minutes of Exercise per Session: Not on file  Stress:   . Feeling of Stress : Not on file  Social Connections:   . Frequency of Communication with Friends and Family: Not on file  . Frequency of Social Gatherings with Friends and Family: Not on file  . Attends Religious Services: Not on file  . Active Member of Clubs or Organizations: Not on file  . Attends Banker Meetings: Not on file  . Marital Status: Not on file  Intimate Partner Violence:   . Fear of Current or Ex-Partner: Not on file  . Emotionally Abused: Not on file  . Physically Abused: Not on file  . Sexually Abused: Not on file    Past Surgical History:  Procedure Laterality Date  . NO PAST SURGERIES    . WISDOM TOOTH EXTRACTION      Family History  Problem Relation Age of Onset  . Diabetes Paternal Grandmother   . Depression Father   . Alcohol abuse Father   . Hypertension Mother     No Known Allergies  Current Outpatient Medications on File Prior to Visit  Medication Sig Dispense Refill  . omeprazole (PRILOSEC) 40 MG capsule TAKE 1 CAPSULE BY MOUTH EVERY DAY 90 capsule 1  . topiramate (TOPAMAX) 50 MG tablet Take 1 tablet (50 mg total) by mouth at bedtime. For headache prevention. (Patient not taking: Reported on 05/07/2020) 30 tablet 0   No current facility-administered medications on file prior to visit.    Ht 5' 6.5" (1.689 m)   Wt (!) 311 lb (141.1 kg)   BMI 49.44 kg/m    Objective:   Physical Exam Pulmonary:     Effort:  Pulmonary effort is normal.     Comments: No cough during visit Neurological:     Mental Status: She is alert and oriented to person, place, and time.  Psychiatric:        Mood and Affect: Mood normal.            Assessment & Plan:

## 2020-07-02 ENCOUNTER — Encounter: Payer: 59 | Admitting: Primary Care

## 2020-08-01 ENCOUNTER — Encounter: Payer: 59 | Admitting: Primary Care

## 2020-08-02 ENCOUNTER — Other Ambulatory Visit: Payer: Self-pay

## 2020-08-02 ENCOUNTER — Ambulatory Visit (INDEPENDENT_AMBULATORY_CARE_PROVIDER_SITE_OTHER): Payer: 59 | Admitting: Primary Care

## 2020-08-02 ENCOUNTER — Encounter: Payer: Self-pay | Admitting: Primary Care

## 2020-08-02 VITALS — BP 118/82 | HR 90 | Temp 97.0°F | Ht 66.5 in | Wt 319.0 lb

## 2020-08-02 DIAGNOSIS — R519 Headache, unspecified: Secondary | ICD-10-CM | POA: Diagnosis not present

## 2020-08-02 DIAGNOSIS — F411 Generalized anxiety disorder: Secondary | ICD-10-CM | POA: Diagnosis not present

## 2020-08-02 DIAGNOSIS — K219 Gastro-esophageal reflux disease without esophagitis: Secondary | ICD-10-CM

## 2020-08-02 MED ORDER — OMEPRAZOLE 40 MG PO CPDR: 40.0000 mg | DELAYED_RELEASE_CAPSULE | Freq: Every day | ORAL | 1 refills | Status: DC

## 2020-08-02 MED ORDER — TOPIRAMATE 50 MG PO TABS: ORAL_TABLET | ORAL | 0 refills | Status: DC

## 2020-08-02 MED ORDER — FAMOTIDINE 20 MG PO TABS: 20.0000 mg | ORAL_TABLET | Freq: Every day | ORAL | 1 refills | Status: DC

## 2020-08-02 MED ORDER — SERTRALINE HCL 50 MG PO TABS: 50.0000 mg | ORAL_TABLET | Freq: Every day | ORAL | 0 refills | Status: DC

## 2020-08-02 NOTE — Assessment & Plan Note (Signed)
Uncontrolled several days weekly despite omeprazole 40 mg daily.  Recommended she start taking omeprazole at bedtime.  Suspect anxiety could be contributing to symptoms.  We will add famotidine 20 mg in the morning, continue omeprazole but move to evening.  We will also work to treat anxiety.  Follow-up in 6 weeks.

## 2020-08-02 NOTE — Patient Instructions (Signed)
Start sertraline (Zoloft) 50 mg daily for anxiety.  Start taking 1/2 tablet once daily for about 1 week, then increase to 1 full tablet thereafter  We increase the dose of your topiramate (Topamax), but start with 1 tablet by mouth at bedtime for 1 to 2 weeks, then increase to 2 tablets at bedtime thereafter.  Continue taking omeprazole 40 mg for heartburn, moved to nighttime. Take famotidine 20 mg every morning for heartburn.  Please schedule a follow up visit for 6 weeks for follow up of anxiety/headaches.  It was a pleasure to see you today!

## 2020-08-02 NOTE — Assessment & Plan Note (Signed)
Daily headaches, no migraines.  Agrees to try topiramate again, also will titrate up dose.  Start with 50 mg nightly x1 to 2 weeks, then increase to 100 mg nightly thereafter.  Follow-up in 6 weeks

## 2020-08-02 NOTE — Progress Notes (Signed)
Subjective:    Patient ID: Halina Maidens, female    DOB: April 01, 1991, 30 y.o.   MRN: [id number]  HPI  This visit occurred during the SARS-CoV-2 public health emergency.  Safety protocols were in place, including screening questions prior to the visit, additional usage of staff PPE, and extensive cleaning of exam room while observing appropriate contact time as indicated for disinfecting solutions.   Ms. Gillingham is a 30 year old female who presents today for follow-up and medication refills.  1) Frequent Headaches: Previously managed on topiramate 50 mg for which initially helped but then became ineffective. She took this for about one month total before stopping. She has not tried a higher dose.   She is having daily headaches located to the bilateral frontal lobes. She denies migraines, photophobia, nausea, photophobia.   2) GERD: Chronic and daily symptoms which present as "burning in my throat". She wakes up with symptoms most everyday. She's been taking omeprazole 40 mg daily which she takes in the morning. Right around lunch time she'll notice symptoms return.   She's never undergone endoscopy. She has a strong FH of GERD.   3) GAD: Chronic and intermittent for years. She was once tried on a medication in the past, doesn't remember the name, but didn't feel well on the medication. Symptoms include feeling anxious, worry, difficulty sleeping.  She is ready for treatment now.  BP Readings from Last 3 Encounters:  08/02/20 118/82  10/11/19 120/82  11/03/18 123/84     Review of Systems  Gastrointestinal:       Esophageal burning  Neurological: Positive for headaches.  Psychiatric/Behavioral: The patient is nervous/anxious.        See HPI       Past Medical History:  Diagnosis Date  . Abnormal uterine bleeding 08/25/2016  . Chicken pox   . GERD (gastroesophageal reflux disease)   . Hypertriglyceridemia 03/07/2015  . Morbid obesity (HCC) 06/22/2013  . Recurrent boils 07/26/2014   . Scabies 02/11/2018  . Vitamin D deficiency      Social History   Socioeconomic History  . Marital status: Married    Spouse name: Not on file  . Number of children: Not on file  . Years of education: Not on file  . Highest education level: Not on file  Occupational History  . Not on file  Tobacco Use  . Smoking status: Never Smoker  . Smokeless tobacco: Never Used  Vaping Use  . Vaping Use: Never used  Substance and Sexual Activity  . Alcohol use: No  . Drug use: No  . Sexual activity: Yes    Birth control/protection: None  Other Topics Concern  . Not on file  Social History Narrative   Married.   No children, undergoing fertility treatments.   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   Enjoys reading, traveling, watching TV.   Social Determinants of Health   Financial Resource Strain: Not on file  Food Insecurity: Not on file  Transportation Needs: Not on file  Physical Activity: Not on file  Stress: Not on file  Social Connections: Not on file  Intimate Partner Violence: Not on file    Past Surgical History:  Procedure Laterality Date  . NO PAST SURGERIES    . WISDOM TOOTH EXTRACTION      Family History  Problem Relation Age of Onset  . Diabetes Paternal Grandmother   . Depression Father   . Alcohol abuse Father   . Hypertension Mother  No Known Allergies  No current outpatient medications on file prior to visit.   No current facility-administered medications on file prior to visit.    BP 118/82   Pulse 90   Temp (!) 97 F (36.1 C) (Temporal)   Ht 5' 6.5" (1.689 m)   Wt (!) 319 lb (144.7 kg)   SpO2 98%   BMI 50.72 kg/m    Objective:   Physical Exam Constitutional:      Appearance: She is well-nourished.  Cardiovascular:     Rate and Rhythm: Normal rate and regular rhythm.  Pulmonary:     Effort: Pulmonary effort is normal.     Breath sounds: Normal breath sounds.  Musculoskeletal:     Cervical back: Neck supple.  Skin:     General: Skin is warm and dry.  Psychiatric:        Mood and Affect: Mood and affect and mood normal.            Assessment & Plan:

## 2020-08-02 NOTE — Assessment & Plan Note (Signed)
Uncontrolled, daily symptoms.  She agrees to treatment and elects for Zoloft 50 mg.  Patient is to take 1/2 tablet daily for 8 days, then advance to 1 full tablet thereafter. We discussed possible side effects of headache, GI upset, drowsiness, and SI/HI. If thoughts of SI/HI develop, we discussed to present to the emergency immediately. Patient verbalized understanding.   Follow up in 6 weeks for re-evaluation.

## 2020-09-25 ENCOUNTER — Encounter: Payer: Self-pay | Admitting: Primary Care

## 2020-09-25 ENCOUNTER — Ambulatory Visit: Payer: 59 | Admitting: Primary Care

## 2020-09-25 ENCOUNTER — Telehealth (INDEPENDENT_AMBULATORY_CARE_PROVIDER_SITE_OTHER): Payer: 59 | Admitting: Primary Care

## 2020-09-25 ENCOUNTER — Other Ambulatory Visit: Payer: Self-pay

## 2020-09-25 VITALS — Ht 66.5 in | Wt 308.0 lb

## 2020-09-25 DIAGNOSIS — F411 Generalized anxiety disorder: Secondary | ICD-10-CM

## 2020-09-25 DIAGNOSIS — K219 Gastro-esophageal reflux disease without esophagitis: Secondary | ICD-10-CM | POA: Diagnosis not present

## 2020-09-25 DIAGNOSIS — R519 Headache, unspecified: Secondary | ICD-10-CM | POA: Diagnosis not present

## 2020-09-25 MED ORDER — TOPIRAMATE 100 MG PO TABS: 100.0000 mg | ORAL_TABLET | Freq: Every day | ORAL | 3 refills | Status: DC

## 2020-09-25 MED ORDER — SERTRALINE HCL 50 MG PO TABS: 50.0000 mg | ORAL_TABLET | Freq: Every day | ORAL | 2 refills | Status: DC

## 2020-09-25 NOTE — Assessment & Plan Note (Signed)
Doing well on omeprazole 40 mg HS, continue same. Continue to avoid triggers.

## 2020-09-25 NOTE — Patient Instructions (Signed)
We changed your topiramate to 100 mg, only take one of these tablets at bedtime for headaches.   Continue Zoloft 50 mg for anxiety and omeprazole 40 mg at bedtime for heartburn.  It was a pleasure to see you today!

## 2020-09-25 NOTE — Progress Notes (Signed)
Patient ID: Jenna Bailey, female    DOB: 19-Feb-1991, 30 y.o.   MRN: [id number]  Virtual visit completed through [employer], a video enabled telemedicine application. Due to national recommendations of social distancing due to COVID-19, a virtual visit is felt to be most appropriate for this patient at this time. Reviewed limitations, risks, security and privacy concerns of performing a virtual visit and the availability of in person appointments. I also reviewed that there may be a patient responsible charge related to this service. The patient agreed to proceed.   Patient location: home Provider location: [place] [facility], office Persons participating in this virtual visit: patient, provider  If any vitals were documented, they were collected by patient at home unless specified below.    Ht 5' 6.5" (1.689 m)   Wt (!) 308 lb (139.7 kg)   BMI 48.97 kg/m    CC: Follow up Subjective:   HPI: Jenna Bailey is a 30 y.o. female presenting on 09/25/2020 for follow up of anxiety and GERD.  She was last evaluated on 08/02/20 for multiple symptoms including GERD, headaches, and anxiety.   For GERD symptoms we decided to move her omeprazole 40 mg to HS and famotidine 20 mg in the AM for GERD symptoms. Since her last visit she's been taking just the omeprazole 40 mg HS and symptoms are much improved. She only took the famotidine for a few days. She's also reduced her consumption of caffeine which seems to be helping.  For migraines we decided to re-initiate Topamax 50 mg for one week, then increase to 100 mg thereafter. She did well on this dose previously. Migraines and headaches have significantly improved, doesn't recall her last migraine, has had a few headaches but overall much improved.  For anxiety we decided to initiate treatment with Zoloft 50 mg. Since her last visit she's doing much better! Positive effects include less irritability, having more patience, feeling less anxious.          Relevant past medical, surgical, family and social history reviewed and updated as indicated. Interim medical history since our last visit reviewed. Allergies and medications reviewed and updated. Outpatient Medications Prior to Visit  Medication Sig Dispense Refill  . omeprazole (PRILOSEC) 40 MG capsule Take 1 capsule (40 mg total) by mouth daily. For heartburn. 90 capsule 1  . sertraline (ZOLOFT) 50 MG tablet Take 1 tablet (50 mg total) by mouth daily. For anxiety. 90 tablet 0  . topiramate (TOPAMAX) 50 MG tablet Take 1 tablet by mouth once nightly for 1-2 weeks, then increase to 2 tablets at bedtime thereafter. For headache prevention. 180 tablet 0  . famotidine (PEPCID) 20 MG tablet Take 1 tablet (20 mg total) by mouth daily. For heartburn. (Patient not taking: Reported on 09/25/2020) 90 tablet 1   No facility-administered medications prior to visit.     Per HPI unless specifically indicated in ROS section below Review of Systems  Gastrointestinal:       Denies GERD  Neurological: Negative for headaches.       See HPI  Psychiatric/Behavioral: The patient is not nervous/anxious.    Objective:  Ht 5' 6.5" (1.689 m)   Wt (!) 308 lb (139.7 kg)   BMI 48.97 kg/m   Wt Readings from Last 3 Encounters:  09/25/20 (!) 308 lb (139.7 kg)  08/02/20 (!) 319 lb (144.7 kg)  05/07/20 (!) 311 lb (141.1 kg)       Physical exam: Gen: alert, NAD, not ill  appearing Pulm: speaks in complete sentences without increased work of breathing Psych: normal mood, normal thought content      Results for orders placed or performed in visit on 10/05/19  Comprehensive metabolic panel  Result Value Ref Range   Sodium 138 135 - 145 mEq/L   Potassium 4.4 3.5 - 5.1 mEq/L   Chloride 102 96 - 112 mEq/L   CO2 30 19 - 32 mEq/L   Glucose, Bld 103 (H) 70 - 99 mg/dL   BUN 8 6 - 23 mg/dL   Creatinine, Ser 8.11 0.40 - 1.20 mg/dL   Total Bilirubin 0.4 0.2 - 1.2 mg/dL   Alkaline Phosphatase 75 39 -  117 U/L   AST 29 0 - 37 U/L   ALT 29 0 - 35 U/L   Total Protein 6.8 6.0 - 8.3 g/dL   Albumin 4.2 3.5 - 5.2 g/dL   GFR 914.78 >29.56 mL/min   Calcium 9.2 8.4 - 10.5 mg/dL  Lipid panel  Result Value Ref Range   Cholesterol 153 0 - 200 mg/dL   Triglycerides 213.0 (H) 0.0 - 149.0 mg/dL   HDL 86.57 (L) >84.69 mg/dL   VLDL 62.9 0.0 - 52.8 mg/dL   LDL Cholesterol 87 0 - 99 mg/dL   Total CHOL/HDL Ratio 5    NonHDL 122.36   CBC  Result Value Ref Range   WBC 7.7 4.0 - 10.5 K/uL   RBC 4.77 3.87 - 5.11 Mil/uL   Platelets 340.0 150.0 - 400.0 K/uL   Hemoglobin 11.7 (L) 12.0 - 15.0 g/dL   HCT 41.3 24.4 - 01.0 %   MCV 76.5 (L) 78.0 - 100.0 fl   MCHC 32.0 30.0 - 36.0 g/dL   RDW 27.2 (H) 53.6 - 64.4 %   Assessment & Plan:   Problem List Items Addressed This Visit      Digestive   GERD (gastroesophageal reflux disease)    Doing well on omeprazole 40 mg HS, continue same. Continue to avoid triggers.        Other   Frequent headaches    Much improved with initiation of Topamax 100 mg, refills provided. Continue same.      Relevant Medications   topiramate (TOPAMAX) 100 MG tablet   sertraline (ZOLOFT) 50 MG tablet   GAD (generalized anxiety disorder)    Significant improvement with Zoloft 50 mg, continue same. Refills provided.       Relevant Medications   sertraline (ZOLOFT) 50 MG tablet       Meds ordered this encounter  Medications  . topiramate (TOPAMAX) 100 MG tablet    Sig: Take 1 tablet (100 mg total) by mouth at bedtime. For headache prevention.    Dispense:  90 tablet    Refill:  3    Order Specific Question:   Supervising Provider    Answer:   BEDSOLE, AMY E [[id number]]  . sertraline (ZOLOFT) 50 MG tablet    Sig: Take 1 tablet (50 mg total) by mouth daily. For anxiety.    Dispense:  90 tablet    Refill:  2    Order Specific Question:   Supervising Provider    Answer:   BEDSOLE, AMY E [[id number]]   No orders of the defined types were placed in this encounter.   I  discussed the assessment and treatment plan with the patient. The patient was provided an opportunity to ask questions and all were answered. The patient agreed with the plan and demonstrated an understanding of the instructions.  The patient was advised to call back or seek an in-person evaluation if the symptoms worsen or if the condition fails to improve as anticipated.  Follow up plan: We changed your topiramate to 100 mg, only take one of these tablets at bedtime for headaches.   Continue Zoloft 50 mg for anxiety and omeprazole 40 mg at bedtime for heartburn.  It was a pleasure to see you today!  No follow-ups on file.  Doreene Nest, NP

## 2020-09-25 NOTE — Assessment & Plan Note (Signed)
Much improved with initiation of Topamax 100 mg, refills provided. Continue same.

## 2020-09-25 NOTE — Assessment & Plan Note (Signed)
Significant improvement with Zoloft 50 mg, continue same. Refills provided.

## 2021-01-23 ENCOUNTER — Other Ambulatory Visit: Payer: Self-pay | Admitting: Primary Care

## 2021-01-23 DIAGNOSIS — K219 Gastro-esophageal reflux disease without esophagitis: Secondary | ICD-10-CM

## 2021-01-24 NOTE — Telephone Encounter (Signed)
Up to date on CPE and no follow up requested called in 6 months

## 2021-06-11 ENCOUNTER — Telehealth: Payer: Self-pay | Admitting: Radiology

## 2021-06-11 NOTE — Telephone Encounter (Signed)
Left message for patient to call [facility] to schedule appt for GYN. Has not been seen since 2020.

## 2021-06-27 ENCOUNTER — Other Ambulatory Visit: Payer: Self-pay

## 2021-06-27 ENCOUNTER — Ambulatory Visit (INDEPENDENT_AMBULATORY_CARE_PROVIDER_SITE_OTHER): Payer: 59 | Admitting: Obstetrics and Gynecology

## 2021-06-27 ENCOUNTER — Encounter: Payer: Self-pay | Admitting: Obstetrics and Gynecology

## 2021-06-27 ENCOUNTER — Other Ambulatory Visit (HOSPITAL_COMMUNITY)
Admission: RE | Admit: 2021-06-27 | Discharge: 2021-06-27 | Disposition: A | Discharge status: Home / Self Care | Payer: 59 | Source: Ambulatory Visit | Attending: Obstetrics and Gynecology | Admitting: Obstetrics and Gynecology

## 2021-06-27 VITALS — BP 155/96 | HR 88 | Wt 313.0 lb

## 2021-06-27 DIAGNOSIS — N921 Excessive and frequent menstruation with irregular cycle: Secondary | ICD-10-CM

## 2021-06-27 DIAGNOSIS — Z975 Presence of (intrauterine) contraceptive device: Secondary | ICD-10-CM | POA: Diagnosis not present

## 2021-06-27 NOTE — Progress Notes (Signed)
Woke up to bleeding on 1/3, bleeding lasted one day now just spotting , still having right sided pelvic pain and low back pain.    Had liletta placed 2020, and had not any issues until now

## 2021-06-27 NOTE — Addendum Note (Signed)
Addended by: Scheryl Marten on: 06/27/2021 03:33 PM   Modules accepted: Orders

## 2021-06-27 NOTE — Progress Notes (Signed)
Obstetrics and Gynecology Visit Return Patient Evaluation  Appointment Date: 06/27/2021  Primary Care Provider: [facility]: [facility]  Chief Complaint: AUB with Penni Bombard IUD  History of Present Illness:  Jenna Bailey is a 31 y.o. with above CC. Patient had Liletta IUD placed at her PP visit on 10/2018. She has been amenorrheic and w/o issue with it. She has no menstrual or PMS s/s with it. On 1/3 she woke up and noticed some blood on her sheets, no pain and bleeding stopped and she has occasional spotting and no pain.   Review of Systems: as noted in the History of Present Illness.  Patient Active Problem List   Diagnosis Date Noted   Acute non-recurrent sinusitis 05/07/2020   GAD (generalized anxiety disorder) 10/11/2019   Anemia 10/11/2019   Preventative health care 07/03/2017   Hypertriglyceridemia 03/07/2015   Frequent headaches 07/20/2013   GERD (gastroesophageal reflux disease) 06/22/2013   Morbid obesity (HCC) 06/22/2013   Medications:  Jenna Bailey had no medications administered during this visit. Current Outpatient Medications  Medication Sig Dispense Refill   omeprazole (PRILOSEC) 40 MG capsule TAKE 1 CAPSULE (40 MG TOTAL) BY MOUTH DAILY. FOR HEARTBURN. 90 capsule 1   sertraline (ZOLOFT) 50 MG tablet Take 1 tablet (50 mg total) by mouth daily. For anxiety. 90 tablet 2   topiramate (TOPAMAX) 100 MG tablet Take 1 tablet (100 mg total) by mouth at bedtime. For headache prevention. 90 tablet 3   No current facility-administered medications for this visit.    Allergies: has No Known Allergies.  Physical Exam:  BP (!) 155/96    Pulse 88    Wt (!) 313 lb (142 kg)    BMI 49.76 kg/m  Body mass index is 49.76 kg/m. General appearance: Well nourished, well developed female in no acute distress.  Abdomen: diffusely non tender to palpation, non distended, and no masses, hernias Neuro/Psych:  Normal mood and affect.     Pelvic exam:  EGBUS: normal Vaginal vault: normal, no blood or discharge Cervix:  normal. Blue IUD strings seem approx 5cm Bimanual: negative  Labs: UPT negative  Assessment: pt stable  Plan:  1. Breakthrough bleeding with IUD IUD strings seem a little long to me but I told her that whoever put it in may just leave them long, but recommend u/s to unsure it has moved lower, etc.  - Cervicovaginal ancillary only( [facility]) - Cytology - PAP( [facility]) - US TRANSVAGINAL NON OB W 3D; Future   RTC: based on u/s results  Cornelia Copa MD Attending [facility])

## 2021-07-01 LAB — CERVICOVAGINAL ANCILLARY ONLY
Bacterial Vaginitis (gardnerella): NEGATIVE
Candida Glabrata: NEGATIVE
Candida Vaginitis: NEGATIVE
Chlamydia: NEGATIVE
Comment: NEGATIVE
Comment: NEGATIVE
Comment: NEGATIVE
Comment: NEGATIVE
Comment: NEGATIVE
Comment: NORMAL
Neisseria Gonorrhea: NEGATIVE
Trichomonas: NEGATIVE

## 2021-07-02 LAB — CYTOLOGY - PAP
Comment: NEGATIVE
Diagnosis: NEGATIVE
High risk HPV: NEGATIVE

## 2021-07-04 ENCOUNTER — Other Ambulatory Visit: Payer: Self-pay

## 2021-07-04 ENCOUNTER — Ambulatory Visit
Admission: RE | Admit: 2021-07-04 | Discharge: 2021-07-04 | Disposition: A | Discharge status: Home / Self Care | Payer: 59 | Source: Ambulatory Visit | Attending: Obstetrics and Gynecology | Admitting: Obstetrics and Gynecology

## 2021-07-04 DIAGNOSIS — Z975 Presence of (intrauterine) contraceptive device: Secondary | ICD-10-CM | POA: Insufficient documentation

## 2021-07-04 DIAGNOSIS — N921 Excessive and frequent menstruation with irregular cycle: Secondary | ICD-10-CM | POA: Diagnosis present

## 2021-07-05 ENCOUNTER — Encounter: Payer: Self-pay | Admitting: Obstetrics and Gynecology

## 2021-07-17 ENCOUNTER — Other Ambulatory Visit: Payer: Self-pay | Admitting: Primary Care

## 2021-07-17 DIAGNOSIS — F411 Generalized anxiety disorder: Secondary | ICD-10-CM

## 2021-07-25 ENCOUNTER — Other Ambulatory Visit: Payer: Self-pay | Admitting: Primary Care

## 2021-07-25 DIAGNOSIS — K219 Gastro-esophageal reflux disease without esophagitis: Secondary | ICD-10-CM

## 2021-07-25 NOTE — Telephone Encounter (Signed)
Patient needs follow up visit for April 2023 for further refills. Please schedule.  Also, how is she doing on the omeprazole 40 mg dose? Would she be willing to reduce to 20 mg to see how she does?

## 2021-07-26 NOTE — Telephone Encounter (Signed)
I spoke with pt and she said she was seen at [facility] today and was given abx for sinus infection. Pt will cb next wk if needed. Pt appreciated call.Sending note to Gentry Fitz NP.

## 2021-07-26 NOTE — Telephone Encounter (Signed)
Noted and appreciate the call. 

## 2021-07-30 NOTE — Telephone Encounter (Signed)
CPE made for April  She has tried the 20mg  in the past and is not helpful. She is having improvement at the 40mg  and does not want to change at this time.

## 2021-07-31 NOTE — Telephone Encounter (Signed)
Noted, will discuss at upcoming CPE

## 2021-08-27 ENCOUNTER — Other Ambulatory Visit: Payer: Self-pay | Admitting: Primary Care

## 2021-08-27 DIAGNOSIS — F411 Generalized anxiety disorder: Secondary | ICD-10-CM

## 2021-09-11 ENCOUNTER — Encounter: Payer: 59 | Admitting: Primary Care

## 2021-09-12 ENCOUNTER — Encounter: Payer: Self-pay | Admitting: Primary Care

## 2021-09-12 ENCOUNTER — Ambulatory Visit (INDEPENDENT_AMBULATORY_CARE_PROVIDER_SITE_OTHER): Payer: 59 | Admitting: Primary Care

## 2021-09-12 VITALS — BP 126/78 | HR 100 | Temp 98.2°F | Ht 66.0 in | Wt 315.0 lb

## 2021-09-12 DIAGNOSIS — R519 Headache, unspecified: Secondary | ICD-10-CM | POA: Diagnosis not present

## 2021-09-12 DIAGNOSIS — R5382 Chronic fatigue, unspecified: Secondary | ICD-10-CM | POA: Diagnosis not present

## 2021-09-12 DIAGNOSIS — Z0001 Encounter for general adult medical examination with abnormal findings: Secondary | ICD-10-CM | POA: Diagnosis not present

## 2021-09-12 DIAGNOSIS — J3089 Other allergic rhinitis: Secondary | ICD-10-CM

## 2021-09-12 DIAGNOSIS — K219 Gastro-esophageal reflux disease without esophagitis: Secondary | ICD-10-CM

## 2021-09-12 DIAGNOSIS — E781 Pure hyperglyceridemia: Secondary | ICD-10-CM

## 2021-09-12 DIAGNOSIS — F411 Generalized anxiety disorder: Secondary | ICD-10-CM

## 2021-09-12 MED ORDER — TOPIRAMATE 25 MG PO TABS: 25.0000 mg | ORAL_TABLET | Freq: Every day | ORAL | 0 refills | Status: DC

## 2021-09-12 MED ORDER — SERTRALINE HCL 50 MG PO TABS: 50.0000 mg | ORAL_TABLET | Freq: Every day | ORAL | 3 refills | Status: DC

## 2021-09-12 MED ORDER — OMEPRAZOLE 40 MG PO CPDR: 40.0000 mg | DELAYED_RELEASE_CAPSULE | Freq: Every day | ORAL | 3 refills | Status: DC

## 2021-09-12 NOTE — Assessment & Plan Note (Signed)
Likely secondary to work schedule, but will rule out metabolic cause. ? ?Labs pending for TSH, CBC, vitamin D, CMP, A1c. ?

## 2021-09-12 NOTE — Assessment & Plan Note (Signed)
Controlled. ? ?Continue sertraline 50 mg daily. ?Refills provided. ?

## 2021-09-12 NOTE — Assessment & Plan Note (Signed)
Immunizations up-to-date. ?Pap smear up-to-date. ? ?Discussed the importance of a healthy diet and regular exercise in order for weight loss, and to reduce the risk of further co-morbidity. ? ?Exam today as noted. ?Labs pending. ?

## 2021-09-12 NOTE — Assessment & Plan Note (Signed)
Controlled. ? ?Failed lower doses of omeprazole and Pepcid. ? ?Continue omeprazole 40 mg daily.  ?Refills provided.  ?

## 2021-09-12 NOTE — Patient Instructions (Signed)
Start topiramate (Topamax) 25 mg every morning for headache prevention. ? ?Continue topiramate (Topamax) 100 mg at bedtime for headache prevention. ? ?Stop by the lab prior to leaving today. I will notify you of your results once received.  ? ?Nasal Congestion/Ear Pressure/Sinus Pressure: Try using Flonase (fluticasone) nasal spray. Instill 1 spray in each nostril twice daily.  ? ?It was a pleasure to see you today! ? ?Preventive Care 22-32 Years Old, Female ?Preventive care refers to lifestyle choices and visits with your health care provider that can promote health and wellness. Preventive care visits are also called wellness exams. ?What can I expect for my preventive care visit? ?Counseling ?During your preventive care visit, your health care provider may ask about your: ?Medical history, including: ?Past medical problems. ?Family medical history. ?Pregnancy history. ?Current health, including: ?Menstrual cycle. ?Method of birth control. ?Emotional well-being. ?Home life and relationship well-being. ?Sexual activity and sexual health. ?Lifestyle, including: ?Alcohol, nicotine or tobacco, and drug use. ?Access to firearms. ?Diet, exercise, and sleep habits. ?Work and work Statistician. ?Sunscreen use. ?Safety issues such as seatbelt and bike helmet use. ?Physical exam ?Your health care provider may check your: ?Height and weight. These may be used to calculate your BMI (body mass index). BMI is a measurement that tells if you are at a healthy weight. ?Waist circumference. This measures the distance around your waistline. This measurement also tells if you are at a healthy weight and may help predict your risk of certain diseases, such as type 2 diabetes and high blood pressure. ?Heart rate and blood pressure. ?Body temperature. ?Skin for abnormal spots. ?What immunizations do I need? ?Vaccines are usually given at various ages, according to a schedule. Your health care provider will recommend vaccines for you  based on your age, medical history, and lifestyle or other factors, such as travel or where you work. ?What tests do I need? ?Screening ?Your health care provider may recommend screening tests for certain conditions. This may include: ?Pelvic exam and Pap test. ?Lipid and cholesterol levels. ?Diabetes screening. This is done by checking your blood sugar (glucose) after you have not eaten for a while (fasting). ?Hepatitis B test. ?Hepatitis C test. ?HIV (human immunodeficiency virus) test. ?STI (sexually transmitted infection) testing, if you are at risk. ?BRCA-related cancer screening. This may be done if you have a family history of breast, ovarian, tubal, or peritoneal cancers. ?Talk with your health care provider about your test results, treatment options, and if necessary, the need for more tests. ?Follow these instructions at home: ?Eating and drinking ? ?Eat a healthy diet that includes fresh fruits and vegetables, whole grains, lean protein, and low-fat dairy products. ?Take vitamin and mineral supplements as recommended by your health care provider. ?Do not drink alcohol if: ?Your health care provider tells you not to drink. ?You are pregnant, may be pregnant, or are planning to become pregnant. ?If you drink alcohol: ?Limit how much you have to 0-1 drink a day. ?Know how much alcohol is in your drink. In the [place] one drink equals one 12 oz bottle of beer (355 mL), one 5 oz glass of wine (148 mL), or one 1? oz glass of hard liquor (44 mL). ?Lifestyle ?Brush your teeth every morning and night with fluoride toothpaste. Floss one time each day. ?Exercise for at least 30 minutes 5 or more days each week. ?Do not use any products that contain nicotine or tobacco. These products include cigarettes, chewing tobacco, and vaping devices, such as e-cigarettes. If you  need help quitting, ask your health care provider. ?Do not use drugs. ?If you are sexually active, practice safe sex. Use a condom or other form of  protection to prevent STIs. ?If you do not wish to become pregnant, use a form of birth control. If you plan to become pregnant, see your health care provider for a prepregnancy visit. ?Find healthy ways to manage stress, such as: ?Meditation, yoga, or listening to music. ?Journaling. ?Talking to a trusted person. ?Spending time with friends and family. ?Minimize exposure to UV radiation to reduce your risk of skin cancer. ?Safety ?Always wear your seat belt while driving or riding in a vehicle. ?Do not drive: ?If you have been drinking alcohol. Do not ride with someone who has been drinking. ?If you have been using any mind-altering substances or drugs. ?While texting. ?When you are tired or distracted. ?Wear a helmet and other protective equipment during sports activities. ?If you have firearms in your house, make sure you follow all gun safety procedures. ?Seek help if you have been physically or sexually abused. ?What's next? ?Go to your health care provider once a year for an annual wellness visit. ?Ask your health care provider how often you should have your eyes and teeth checked. ?Stay up to date on all vaccines. ?This information is not intended to replace advice given to you by your health care provider. Make sure you discuss any questions you have with your health care provider. ?Document Revised: 11/21/2020 Document Reviewed: 11/21/2020 ?Elsevier Patient Education ? [place]. ? ?

## 2021-09-12 NOTE — Assessment & Plan Note (Signed)
Continue antihistamine daily. ?Add Flonase 1 spray in each nostril twice daily. ? ?Follow-up as needed. ?

## 2021-09-12 NOTE — Progress Notes (Signed)
? ?Subjective:  ? ? Patient ID: Jenna Bailey, female    DOB: 1991/05/01, 31 y.o.   MRN: [id number] ? ?HPI ? ?Jenna Bailey is a very pleasant 31 y.o. female who presents today for complete physical and follow up of chronic conditions. ? ?Jenna Bailey would also like to discuss fatigue and allergies. ? ?Chronic fatigue for years. Jenna Bailey transitions between day and evening shifts at work. Jenna Bailey sleeps well overall. Jenna Bailey feels tired during the day. Jenna Bailey suspects vitamin D maybe low as Jenna Bailey is experiencing symptoms of fatigue. Historically her fatigue is present when vitamin D level has been low. Jenna Bailey is not taking vitamin D supplements. Overall Jenna Bailey feels that her anxiety is controlled.  ? ?Chronic history of season allergies which include itchy throat, itchy eyes, post nasal drip, rhinorrhea. Yesterday Jenna Bailey began to notice right frontal headache, headache behind her eyes, lime green mucous. Jenna Bailey's been taking an allergy pill for about 1 month. Jenna Bailey has a history of sinusitis, last infection was in February 2023, treated with antibiotics. Symptoms today feel different.  ? ?Jenna Bailey continues to notice headaches twice weekly, overall improved since management on Topamax 100 mg. Jenna Bailey will take Ibuprofen for breakthrough headaches. Jenna Bailey uses Excedrin Migraine for migraines which occur once monthly on average.  ? ?Immunizations: ?-Tetanus: 2020 ?-Influenza: Completed this season ?-Covid-19: 2 vaccines ? ?Diet: Fair diet.  ?Exercise: No regular exercise. ? ?Eye exam: Completes annually  ?Dental exam: Completes semi-annually  ? ?Pap Smear: Completed in 2023 ? ? ? ? ? ? ? ?Review of Systems  ?Constitutional:  Positive for fatigue. Negative for unexpected weight change.  ?HENT:  Positive for sinus pressure. Negative for rhinorrhea.   ?Respiratory:  Negative for cough and shortness of breath.   ?Cardiovascular:  Negative for chest pain.  ?Gastrointestinal:  Negative for constipation and diarrhea.  ?Genitourinary:  Negative for difficulty urinating and  menstrual problem.  ?Musculoskeletal:  Negative for arthralgias and myalgias.  ?Skin:  Negative for rash.  ?Allergic/Immunologic: Positive for environmental allergies.  ?Neurological:  Positive for headaches. Negative for dizziness.  ?Psychiatric/Behavioral:  The patient is not nervous/anxious.   ? ?   ? ? ?Past Medical History:  ?Diagnosis Date  ? Abnormal uterine bleeding 08/25/2016  ? Chicken pox   ? GERD (gastroesophageal reflux disease)   ? Hypertriglyceridemia 03/07/2015  ? Morbid obesity (HCC) 06/22/2013  ? Recurrent boils 07/26/2014  ? Scabies 02/11/2018  ? Vitamin D deficiency   ? ? ?Social History  ? ?Socioeconomic History  ? Marital status: Married  ?  Spouse name: Not on file  ? Number of children: Not on file  ? Years of education: Not on file  ? Highest education level: Not on file  ?Occupational History  ? Not on file  ?Tobacco Use  ? Smoking status: Never  ? Smokeless tobacco: Never  ?Vaping Use  ? Vaping Use: Never used  ?Substance and Sexual Activity  ? Alcohol use: No  ? Drug use: No  ? Sexual activity: Yes  ?  Birth control/protection: I.U.D.  ?Other Topics Concern  ? Not on file  ?Social History Narrative  ? Married.  ? No children, undergoing fertility treatments.  ? 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  ? Enjoys reading, traveling, watching TV.  ? ?Social Determinants of Health  ? ?Financial Resource Strain: Not on file  ?Food Insecurity: Not on file  ?Transportation Needs: Not on file  ?Physical Activity: Not on file  ?Stress: Not on file  ?Social  Connections: Not on file  ?Intimate Partner Violence: Not on file  ? ? ?Past Surgical History:  ?Procedure Laterality Date  ? NO PAST SURGERIES    ? WISDOM TOOTH EXTRACTION    ? ? ?Family History  ?Problem Relation Age of Onset  ? Diabetes Paternal Grandmother   ? Depression Father   ? Alcohol abuse Father   ? Hypertension Mother   ? ? ?No Known Allergies ? ?Current Outpatient Medications on File Prior to Visit  ?Medication Sig Dispense Refill  ?  levonorgestrel (LILETTA, 52 MG,) 20.1 MCG/DAY IUD 1 each by Intrauterine route once.    ? topiramate (TOPAMAX) 100 MG tablet Take 1 tablet (100 mg total) by mouth at bedtime. For headache prevention. 90 tablet 3  ? ?No current facility-administered medications on file prior to visit.  ? ? ?BP 126/78   Pulse 100   Temp 98.2 ?F (36.8 ?C)   Ht 5\' 6"  (1.676 m)   Wt (!) 315 lb (142.9 kg)   LMP  (LMP Unknown) Comment: had a period in 2/ 2023 not sure of date  SpO2 97%   BMI 50.84 kg/m?  ?Objective:  ? Physical Exam ?HENT:  ?   Right Ear: Tympanic membrane and ear canal normal.  ?   Left Ear: Tympanic membrane and ear canal normal.  ?   Nose: Nose normal.  ?Eyes:  ?   Conjunctiva/sclera: Conjunctivae normal.  ?   Pupils: Pupils are equal, round, and reactive to light.  ?Neck:  ?   Thyroid: No thyromegaly.  ?Cardiovascular:  ?   Rate and Rhythm: Normal rate and regular rhythm.  ?   Heart sounds: No murmur heard. ?Pulmonary:  ?   Effort: Pulmonary effort is normal.  ?   Breath sounds: Normal breath sounds. No rales.  ?Abdominal:  ?   General: Bowel sounds are normal.  ?   Palpations: Abdomen is soft.  ?   Tenderness: There is no abdominal tenderness.  ?Musculoskeletal:     ?   General: Normal range of motion.  ?   Cervical back: Neck supple.  ?Lymphadenopathy:  ?   Cervical: No cervical adenopathy.  ?Skin: ?   General: Skin is warm and dry.  ?   Findings: No rash.  ?Neurological:  ?   Mental Status: Jenna Bailey is alert and oriented to person, place, and time.  ?   Cranial Nerves: No cranial nerve deficit.  ?   Deep Tendon Reflexes: Reflexes are normal and symmetric.  ?Psychiatric:     ?   Mood and Affect: Mood normal.  ? ? ? ? ? ?   ?Assessment & Plan:  ? ? ? ? ?This visit occurred during the SARS-CoV-2 public health emergency.  Safety protocols were in place, including screening questions prior to the visit, additional usage of staff PPE, and extensive cleaning of exam room while observing appropriate contact time as  indicated for disinfecting solutions.  ?

## 2021-09-12 NOTE — Assessment & Plan Note (Signed)
Overall improved, still with weekly headaches. ? ?Continue Topamax 100 mg at bedtime. ?Start Topamax 25 mg in the morning. ? ?She will update if no improvement. ?

## 2021-09-12 NOTE — Assessment & Plan Note (Signed)
Discussed the importance of a healthy diet and regular exercise in order for weight loss, and to reduce the risk of further co-morbidity.  Repeat lipid panel pending. 

## 2021-09-13 LAB — HEMOGLOBIN A1C
Hgb A1c MFr Bld: 5.8 % of total Hgb — ABNORMAL HIGH (ref <5.7)
Mean Plasma Glucose: 120 mg/dL
eAG (mmol/L): 6.6 mmol/L

## 2021-09-13 LAB — COMPREHENSIVE METABOLIC PANEL
AG Ratio: 1.6 (calc) (ref 1.0–2.5)
ALT: 37 U/L — ABNORMAL HIGH (ref 6–29)
AST: 36 U/L — ABNORMAL HIGH (ref 10–30)
Albumin: 4.5 g/dL (ref 3.6–5.1)
Alkaline phosphatase (APISO): 73 U/L (ref 31–125)
BUN: 9 mg/dL (ref 7–25)
CO2: 25 mmol/L (ref 20–32)
Calcium: 9.4 mg/dL (ref 8.6–10.2)
Chloride: 106 mmol/L (ref 98–110)
Creat: 0.65 mg/dL (ref 0.50–0.97)
Globulin: 2.9 g/dL (calc) (ref 1.9–3.7)
Glucose, Bld: 86 mg/dL (ref 65–99)
Potassium: 4.2 mmol/L (ref 3.5–5.3)
Sodium: 140 mmol/L (ref 135–146)
Total Bilirubin: 0.4 mg/dL (ref 0.2–1.2)
Total Protein: 7.4 g/dL (ref 6.1–8.1)

## 2021-09-13 LAB — CBC
HCT: 38.8 % (ref 35.0–45.0)
Hemoglobin: 12.5 g/dL (ref 11.7–15.5)
MCH: 26.2 pg — ABNORMAL LOW (ref 27.0–33.0)
MCHC: 32.2 g/dL (ref 32.0–36.0)
MCV: 81.2 fL (ref 80.0–100.0)
MPV: 10.4 fL (ref 7.5–12.5)
Platelets: 326 10*3/uL (ref 140–400)
RBC: 4.78 10*6/uL (ref 3.80–5.10)
RDW: 14 % (ref 11.0–15.0)
WBC: 9.7 10*3/uL (ref 3.8–10.8)

## 2021-09-13 LAB — LIPID PANEL
Cholesterol: 169 mg/dL (ref <200)
HDL: 31 mg/dL — ABNORMAL LOW (ref >=50)
LDL Cholesterol (Calc): 107 mg/dL (calc) — ABNORMAL HIGH
Non-HDL Cholesterol (Calc): 138 mg/dL (calc) — ABNORMAL HIGH (ref <130)
Total CHOL/HDL Ratio: 5.5 (calc) — ABNORMAL HIGH (ref <5.0)
Triglycerides: 185 mg/dL — ABNORMAL HIGH (ref <150)

## 2021-09-13 LAB — TSH: TSH: 3.67 mIU/L

## 2021-09-13 LAB — VITAMIN D 25 HYDROXY (VIT D DEFICIENCY, FRACTURES): Vit D, 25-Hydroxy: 26 ng/mL — ABNORMAL LOW (ref 30–100)

## 2021-10-08 ENCOUNTER — Other Ambulatory Visit: Payer: Self-pay | Admitting: Primary Care

## 2021-10-08 DIAGNOSIS — R519 Headache, unspecified: Secondary | ICD-10-CM

## 2021-10-29 MED ORDER — TOPIRAMATE 100 MG PO TABS: 100.0000 mg | ORAL_TABLET | Freq: Every day | ORAL | 2 refills | Status: DC

## 2021-10-29 NOTE — Addendum Note (Signed)
Addended by: Doreene Nest on: 10/29/2021 06:44 AM   Modules accepted: Orders

## 2021-12-08 ENCOUNTER — Other Ambulatory Visit: Payer: Self-pay | Admitting: Primary Care

## 2021-12-08 DIAGNOSIS — R519 Headache, unspecified: Secondary | ICD-10-CM

## 2022-01-31 ENCOUNTER — Other Ambulatory Visit: Payer: Self-pay | Admitting: Primary Care

## 2022-01-31 DIAGNOSIS — R519 Headache, unspecified: Secondary | ICD-10-CM

## 2022-02-03 ENCOUNTER — Telehealth (INDEPENDENT_AMBULATORY_CARE_PROVIDER_SITE_OTHER): Payer: 59 | Admitting: Primary Care

## 2022-02-03 VITALS — Ht 66.0 in | Wt 315.0 lb

## 2022-02-03 DIAGNOSIS — R519 Headache, unspecified: Secondary | ICD-10-CM

## 2022-02-03 MED ORDER — SUMATRIPTAN SUCCINATE 50 MG PO TABS: ORAL_TABLET | ORAL | 0 refills | Status: DC

## 2022-02-03 MED ORDER — PROPRANOLOL HCL ER 80 MG PO CP24: 80.0000 mg | ORAL_CAPSULE | Freq: Every day | ORAL | 0 refills | Status: DC

## 2022-02-03 NOTE — Progress Notes (Signed)
Patient ID: Jenna Bailey, female    DOB: 1991/04/05, 31 y.o.   MRN: [id number]  Virtual visit completed through [employer], a video enabled telemedicine application. Due to national recommendations of social distancing due to COVID-19, a virtual visit is felt to be most appropriate for this patient at this time. Reviewed limitations, risks, security and privacy concerns of performing a virtual visit and the availability of in person appointments. I also reviewed that there may be a patient responsible charge related to this service. The patient agreed to proceed.   Patient location: home Provider location: [place] [facility], office Persons participating in this virtual visit: patient, provider   If any vitals were documented, they were collected by patient at home unless specified below.    Ht 5\' 6"  (1.676 m)   Wt (!) 315 lb (142.9 kg) Comment: per patient  BMI 50.84 kg/m    CC: Frequent headaches/migraines Subjective:   HPI: Jenna Bailey is a 31 y.o. female with a history of frequent headaches, migraines presenting on 02/03/2022 for Headache  Currently managed on Topamax 125 mg that was increased from Topamax 100 mg in April 2023 due to continued weekly headaches occurring twice weekly on average.   Since her last visit she stopped taking the extra 25 mg of Topamax as this didn't help with headache frequency and interacted with her omeprazole. She is experiencing daily headaches to the frontal lobes, the headaches are always close to turning into a migraine. Over the last two days her headaches have increased in intensity.   She was once managed on propranolol ER which helped but became cost prohibitive. She stopped taking the Topamax 25 mg dose a few months ago. She has never undergone imaging of her brain. She's experienced headaches for years. During migraines she will experience nausea, photophobia, phonophobia.       Relevant past medical, surgical, family and social  history reviewed and updated as indicated. Interim medical history since our last visit reviewed. Allergies and medications reviewed and updated. Outpatient Medications Prior to Visit  Medication Sig Dispense Refill   levonorgestrel (LILETTA, 52 MG,) 20.1 MCG/DAY IUD 1 each by Intrauterine route once.     omeprazole (PRILOSEC) 40 MG capsule Take 1 capsule (40 mg total) by mouth daily. For heartburn. 90 capsule 3   sertraline (ZOLOFT) 50 MG tablet Take 1 tablet (50 mg total) by mouth daily. For anxiety. 90 tablet 3   topiramate (TOPAMAX) 100 MG tablet Take 1 tablet (100 mg total) by mouth at bedtime. For headache prevention. 90 tablet 2   topiramate (TOPAMAX) 25 MG tablet TAKE 1 TABLET (25 MG TOTAL) BY MOUTH DAILY. FOR HEADACHE PREVENTION. (Patient not taking: Reported on 02/03/2022) 90 tablet 2   No facility-administered medications prior to visit.     Per HPI unless specifically indicated in ROS section below Review of Systems  Eyes:  Positive for photophobia.  Gastrointestinal:  Positive for nausea.  Neurological:  Positive for headaches. Negative for dizziness.   Objective:  Ht 5\' 6"  (1.676 m)   Wt (!) 315 lb (142.9 kg) Comment: per patient  BMI 50.84 kg/m   Wt Readings from Last 3 Encounters:  02/03/22 (!) 315 lb (142.9 kg)  09/12/21 (!) 315 lb (142.9 kg)  06/27/21 (!) 313 lb (142 kg)       Physical exam: General: Alert and oriented x 3, no distress, does not appear sickly  Pulmonary: Speaks in complete sentences without increased work of breathing, no  cough during visit.  Psychiatric: Normal mood, thought content, and behavior.     Results for orders placed or performed in visit on 09/12/21  VITAMIN D 25 Hydroxy (Vit-D Deficiency, Fractures)  Result Value Ref Range   Vit D, 25-Hydroxy 26 (L) 30 - 100 ng/mL  Lipid panel  Result Value Ref Range   Cholesterol 169 <200 mg/dL   HDL 31 (L) > OR = 50 mg/dL   Triglycerides 086 (H) <150 mg/dL   LDL Cholesterol (Calc) 107  (H) mg/dL (calc)   Total CHOL/HDL Ratio 5.5 (H) <5.0 (calc)   Non-HDL Cholesterol (Calc) 138 (H) <130 mg/dL (calc)  Hemoglobin V7Q  Result Value Ref Range   Hgb A1c MFr Bld 5.8 (H) <5.7 % of total Hgb   Mean Plasma Glucose 120 mg/dL   eAG (mmol/L) 6.6 mmol/L  Comprehensive metabolic panel  Result Value Ref Range   Glucose, Bld 86 65 - 99 mg/dL   BUN 9 7 - 25 mg/dL   Creat 4.69 6.29 - 5.28 mg/dL   BUN/Creatinine Ratio NOT APPLICABLE 6 - 22 (calc)   Sodium 140 135 - 146 mmol/L   Potassium 4.2 3.5 - 5.3 mmol/L   Chloride 106 98 - 110 mmol/L   CO2 25 20 - 32 mmol/L   Calcium 9.4 8.6 - 10.2 mg/dL   Total Protein 7.4 6.1 - 8.1 g/dL   Albumin 4.5 3.6 - 5.1 g/dL   Globulin 2.9 1.9 - 3.7 g/dL (calc)   AG Ratio 1.6 1.0 - 2.5 (calc)   Total Bilirubin 0.4 0.2 - 1.2 mg/dL   Alkaline phosphatase (APISO) 73 31 - 125 U/L   AST 36 (H) 10 - 30 U/L   ALT 37 (H) 6 - 29 U/L  CBC  Result Value Ref Range   WBC 9.7 3.8 - 10.8 Thousand/uL   RBC 4.78 3.80 - 5.10 Million/uL   Hemoglobin 12.5 11.7 - 15.5 g/dL   HCT 41.3 24.4 - 01.0 %   MCV 81.2 80.0 - 100.0 fL   MCH 26.2 (L) 27.0 - 33.0 pg   MCHC 32.2 32.0 - 36.0 g/dL   RDW 27.2 53.6 - 64.4 %   Platelets 326 140 - 400 Thousand/uL   MPV 10.4 7.5 - 12.5 fL  TSH  Result Value Ref Range   TSH 3.67 mIU/L   Assessment & Plan:   Problem List Items Addressed This Visit       Other   Frequent headaches - Primary    Deteriorated, no improvement with increased dose of Topamax.  We will wean off Topamax.  Instructions provided to patient.  Start propanolol ER 80 mg at bedtime for headache prevention as this was effective historically.  She will notify if medication is cost prohibitive again.  She has since changed insurance companies.  Start sumatriptan 50 mg as needed for migraine abortion.  Discussed instructions with patient.  Drowsiness precautions provided.  We will also obtain MRI brain as she has not undergone imaging.  She agrees.  Orders  placed.  She will update via MyChart in a few weeks, or she will reach out sooner if she experiences a problem with propanolol.      Relevant Medications   propranolol ER (INDERAL LA) 80 MG 24 hr capsule   SUMAtriptan (IMITREX) 50 MG tablet   Other Relevant Orders   MR BRAIN WO CONTRAST     Meds ordered this encounter  Medications   propranolol ER (INDERAL LA) 80 MG 24 hr capsule  Sig: Take 1 capsule (80 mg total) by mouth at bedtime. For headache prevention    Dispense:  90 capsule    Refill:  0    Order Specific Question:   Supervising Provider    Answer:   BEDSOLE, AMY E [[id number]]   SUMAtriptan (IMITREX) 50 MG tablet    Sig: Take 1 tablet at migraine onset. May repeat in 2 hours if headache persists or recurs.    Dispense:  10 tablet    Refill:  0    Order Specific Question:   Supervising Provider    Answer:   Ermalene Searing, AMY E [[id number]]   Orders Placed This Encounter  Procedures   MR BRAIN WO CONTRAST    31 year old female with chronic headaches and migraines, increased intensity, never undergone imaging.    Standing Status:   Future    Standing Expiration Date:   02/04/2023    Order Specific Question:   What is the patient's sedation requirement?    Answer:   No Sedation    Order Specific Question:   Does the patient have a pacemaker or implanted devices?    Answer:   No    Order Specific Question:   Preferred imaging location?    Answer:   [facility] (table limit-350lbs)    I discussed the assessment and treatment plan with the patient. The patient was provided an opportunity to ask questions and all were answered. The patient agreed with the plan and demonstrated an understanding of the instructions. The patient was advised to call back or seek an in-person evaluation if the symptoms worsen or if the condition fails to improve as anticipated.  Follow up plan:  Reduce your Topamax to 50 mg and wean off as discussed.  Start propranolol ER 80 mg at bedtime for  headache prevention.   You may take sumatriptan 50 mg at migraine onset. Okay to take second dose in 2 hours if migraine persists. This may cause drowsiness.   You will be contacted regarding your MRI.  Please let us know if you have not been contacted within two weeks.   Please update me in a few weeks!   Doreene Nest, NP

## 2022-02-03 NOTE — Assessment & Plan Note (Signed)
Deteriorated, no improvement with increased dose of Topamax.  We will wean off Topamax.  Instructions provided to patient.  Start propanolol ER 80 mg at bedtime for headache prevention as this was effective historically.  She will notify if medication is cost prohibitive again.  She has since changed insurance companies.  Start sumatriptan 50 mg as needed for migraine abortion.  Discussed instructions with patient.  Drowsiness precautions provided.  We will also obtain MRI brain as she has not undergone imaging.  She agrees.  Orders placed.  She will update via MyChart in a few weeks, or she will reach out sooner if she experiences a problem with propanolol.

## 2022-02-03 NOTE — Patient Instructions (Signed)
Reduce your Topamax to 50 mg and wean off as discussed.  Start propranolol ER 80 mg at bedtime for headache prevention.   You may take sumatriptan 50 mg at migraine onset. Okay to take second dose in 2 hours if migraine persists. This may cause drowsiness.   You will be contacted regarding your MRI.  Please let us know if you have not been contacted within two weeks.   Please update me in a few weeks!

## 2022-02-06 ENCOUNTER — Ambulatory Visit
Admission: RE | Admit: 2022-02-06 | Discharge: 2022-02-06 | Disposition: A | Discharge status: Home / Self Care | Payer: 59 | Source: Ambulatory Visit | Attending: Primary Care | Admitting: Primary Care

## 2022-02-06 DIAGNOSIS — R519 Headache, unspecified: Secondary | ICD-10-CM | POA: Diagnosis present

## 2022-02-07 IMAGING — US US PELVIS COMPLETE WITH TRANSVAGINAL
1 series · 15 of 25 positions shown · non-contrast
Comparison: None.

CLINICAL DATA: Irregular menstrual bleeding

EXAM:
TRANSABDOMINAL AND TRANSVAGINAL ULTRASOUND OF PELVIS
DOPPLER ULTRASOUND OF OVARIES
TECHNIQUE: Both transabdominal and transvaginal ultrasound examinations of the
pelvis were performed. Transabdominal technique was performed for
global imaging of the pelvis including uterus, ovaries, adnexal
regions, and pelvic cul-de-sac.
It was necessary to proceed with endovaginal exam following the
transabdominal exam to visualize the ovaries. Color and duplex
Doppler ultrasound was utilized to evaluate blood flow to the
ovaries.

[Series 1: us pelvis complete · 15 of 103 slices shown]
[im 1/103]
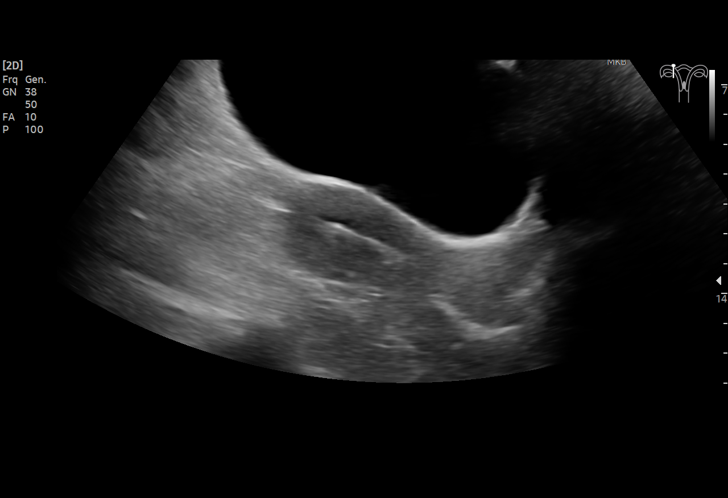
[im 9/103]
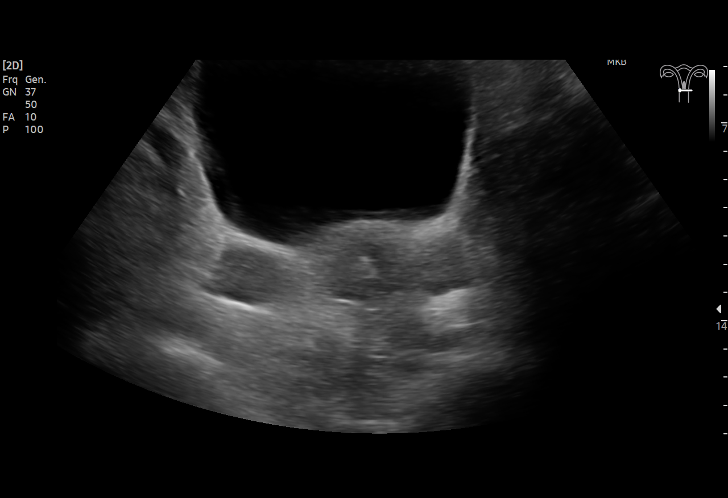
[im 18/103]
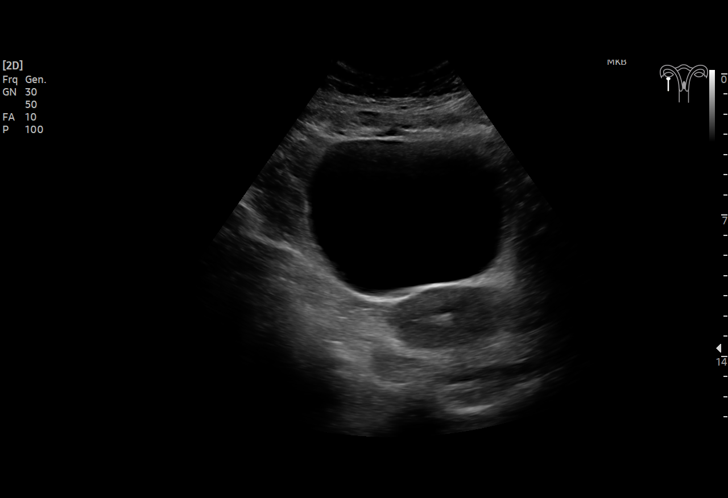
[im 22/103]
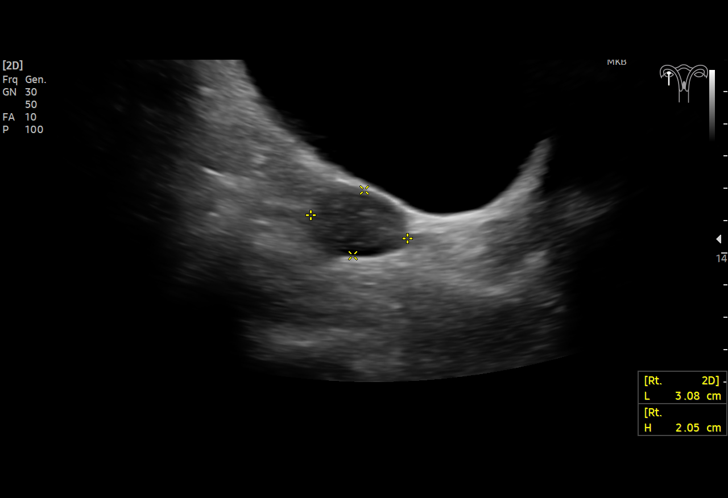
[im 30/103]
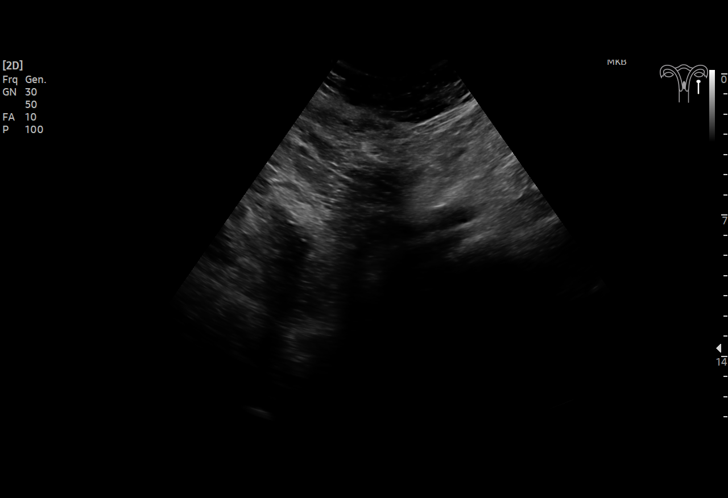
[im 39/103]
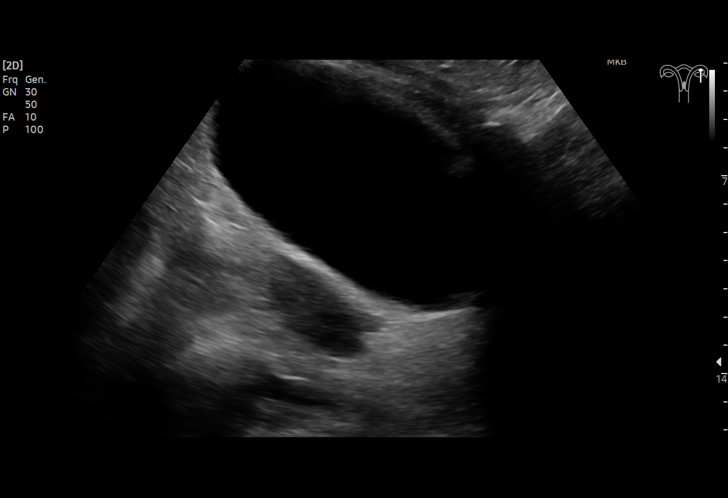
[im 43/103]
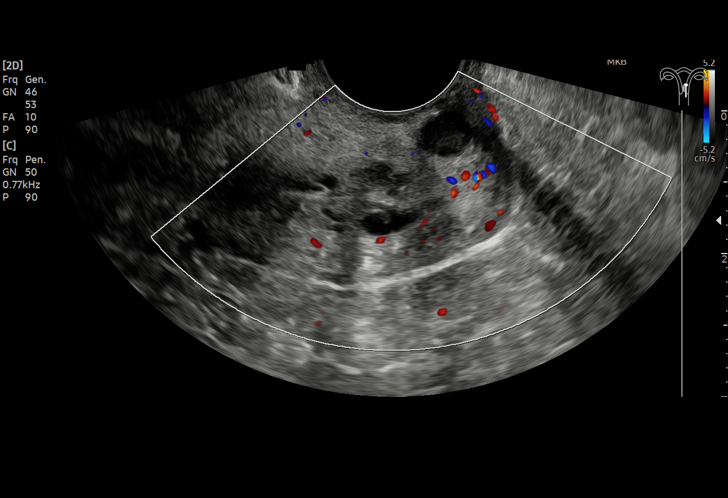
[im 52/103]
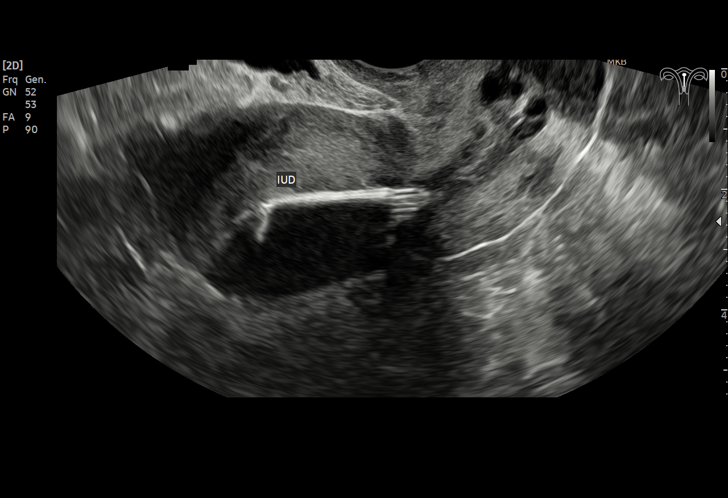
[im 60/103]
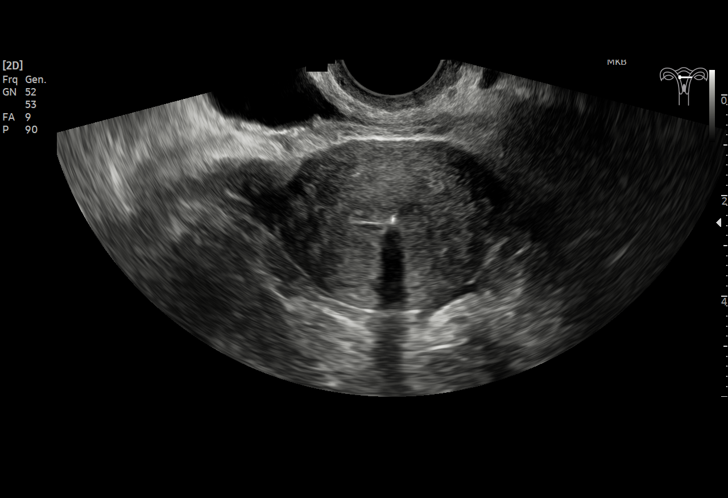
[im 64/103]
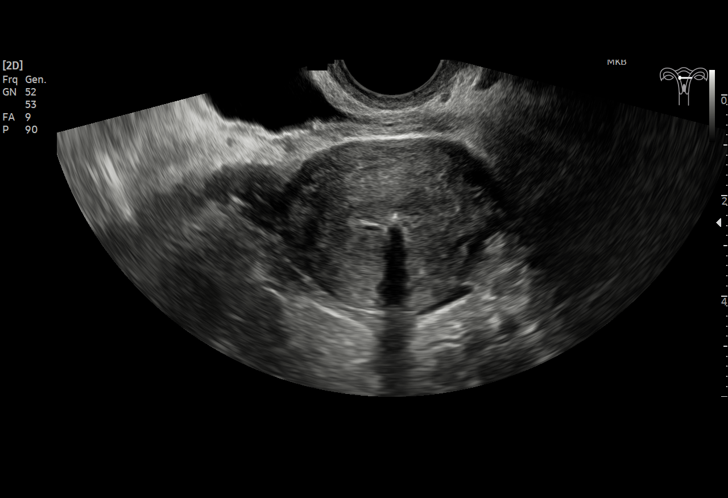
[im 73/103]
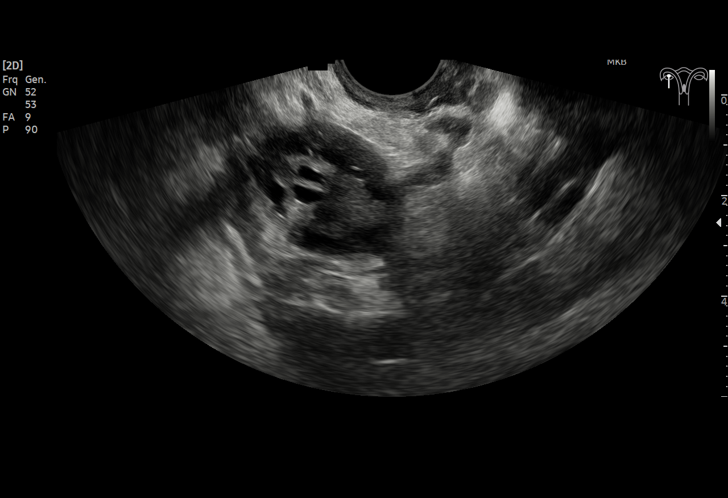
[im 81/103]
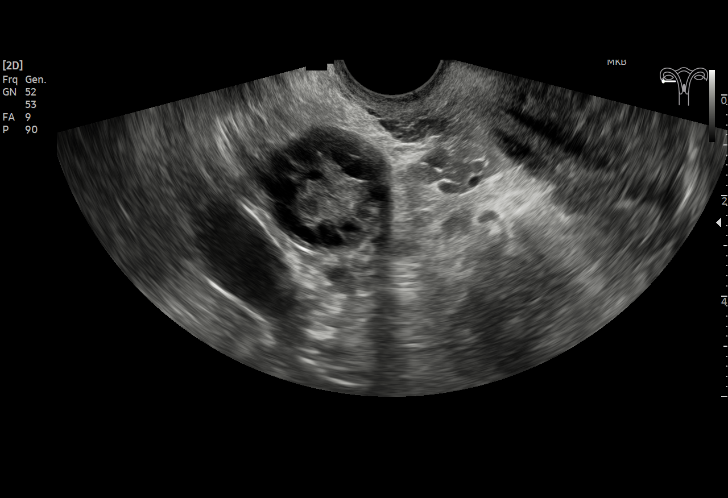
[im 86/103]
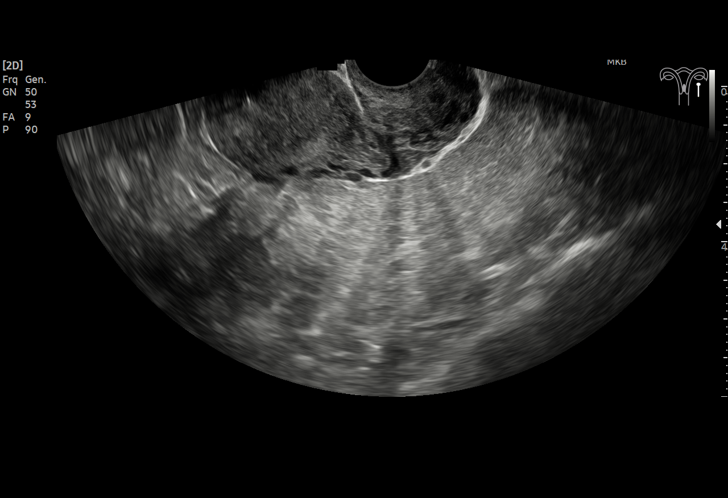
[im 94/103]
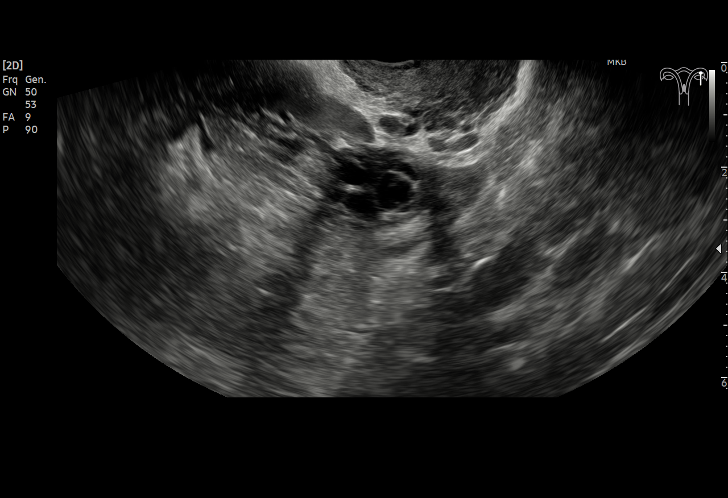
[im 103/103]
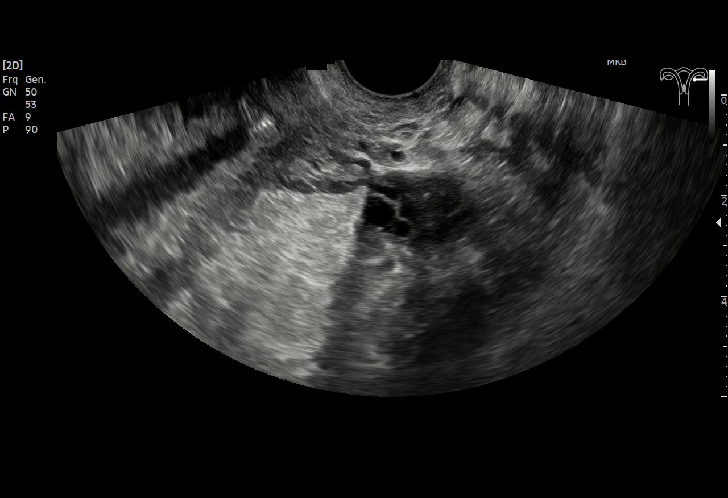

[15 of 25 positions shown; findings below may reference images not displayed]

FINDINGS: Uterus

Measurements: 6.7 x 3.5 x 4.4 cm = volume: 53.9 mL. No fibroids or
other mass visualized. Nabothian cysts are seen in the cervix.

Endometrium

Thickness: 2.6 mm.  IUD is seen in the endometrial cavity.

Right ovary

Measurements: 3.1 x 2.5 x 2.5 cm = volume: 10.1 mL. Multiple small
follicles are seen.

Left ovary

Measurements: 2.6 x 2.2 x 2.8 cm = volume: 8.8 mL. Multiple small
follicles are seen.

Color evaluation of both ovaries demonstrates vascular flow in both
adnexal regions.

Other findings

No abnormal free fluid.
IMPRESSION: IUD is seen in the uterine cavity in the usual position. There is no
abnormal thickening of endometrium. There are no adnexal masses.

## 2022-04-28 ENCOUNTER — Other Ambulatory Visit: Payer: Self-pay | Admitting: Primary Care

## 2022-04-28 DIAGNOSIS — R519 Headache, unspecified: Secondary | ICD-10-CM

## 2022-04-29 ENCOUNTER — Other Ambulatory Visit: Payer: Self-pay | Admitting: Primary Care

## 2022-04-29 DIAGNOSIS — R519 Headache, unspecified: Secondary | ICD-10-CM

## 2022-07-16 ENCOUNTER — Other Ambulatory Visit: Payer: Self-pay | Admitting: Primary Care

## 2022-07-16 DIAGNOSIS — R519 Headache, unspecified: Secondary | ICD-10-CM

## 2022-07-17 NOTE — Telephone Encounter (Signed)
Pt is due for CPE mid April

## 2022-07-17 NOTE — Telephone Encounter (Signed)
Patient is due for CPE/follow up in mid April, this will be required prior to any further refills.  Please schedule, thank you!

## 2022-07-17 NOTE — Telephone Encounter (Signed)
Lvmtcb, sent mychart message  

## 2022-08-23 ENCOUNTER — Other Ambulatory Visit: Payer: Self-pay | Admitting: Primary Care

## 2022-08-23 DIAGNOSIS — K219 Gastro-esophageal reflux disease without esophagitis: Secondary | ICD-10-CM

## 2022-09-16 ENCOUNTER — Other Ambulatory Visit: Payer: Self-pay | Admitting: Primary Care

## 2022-09-16 ENCOUNTER — Encounter: Payer: Self-pay | Admitting: Primary Care

## 2022-09-16 ENCOUNTER — Ambulatory Visit (INDEPENDENT_AMBULATORY_CARE_PROVIDER_SITE_OTHER): Payer: 59 | Admitting: Primary Care

## 2022-09-16 VITALS — BP 118/76 | HR 76 | Temp 97.6°F | Ht 66.0 in | Wt 335.0 lb

## 2022-09-16 DIAGNOSIS — Z0001 Encounter for general adult medical examination with abnormal findings: Secondary | ICD-10-CM

## 2022-09-16 DIAGNOSIS — R7303 Prediabetes: Secondary | ICD-10-CM | POA: Insufficient documentation

## 2022-09-16 DIAGNOSIS — R519 Headache, unspecified: Secondary | ICD-10-CM | POA: Diagnosis not present

## 2022-09-16 DIAGNOSIS — F411 Generalized anxiety disorder: Secondary | ICD-10-CM | POA: Diagnosis not present

## 2022-09-16 DIAGNOSIS — Z6841 Body Mass Index (BMI) 40.0 and over, adult: Secondary | ICD-10-CM

## 2022-09-16 DIAGNOSIS — E781 Pure hyperglyceridemia: Secondary | ICD-10-CM

## 2022-09-16 DIAGNOSIS — K219 Gastro-esophageal reflux disease without esophagitis: Secondary | ICD-10-CM

## 2022-09-16 MED ORDER — AMITRIPTYLINE HCL 25 MG PO TABS
25.0000 mg | ORAL_TABLET | Freq: Every day | ORAL | 0 refills | Status: DC
Start: 2022-09-16 — End: 2022-12-06

## 2022-09-16 MED ORDER — SUMATRIPTAN SUCCINATE 50 MG PO TABS
ORAL_TABLET | ORAL | 0 refills | Status: DC
Start: 2022-09-16 — End: 2024-03-23

## 2022-09-16 NOTE — Assessment & Plan Note (Signed)
Repeat lipid panel pending.  Discussed the importance of a healthy diet and regular exercise in order for weight loss, and to reduce the risk of further co-morbidity.  

## 2022-09-16 NOTE — Patient Instructions (Signed)
Stop taking propranolol ER 80 mg for headache prevention.  Start amitriptyline 25 mg at bedtime for headache prevention.  Stop by the lab prior to leaving today. I will notify you of your results once received.   Please update me regarding your headaches!  It was a pleasure to see you today!

## 2022-09-16 NOTE — Assessment & Plan Note (Signed)
No improvement.  Stop propranolol ER 80 mg daily. Start amitriptyline  25 mg HS.  Continue sumatriptan 50 mg PRN.

## 2022-09-16 NOTE — Assessment & Plan Note (Signed)
Commended her on dietary changes and physical exercise! Encouraged to continue!  Discussed other options including Wegovy, Zepbound, Ozempic. She will check with her insurance first.

## 2022-09-16 NOTE — Assessment & Plan Note (Signed)
Controlled.  Continue sertraline 50 mg daily. 

## 2022-09-16 NOTE — Assessment & Plan Note (Signed)
Repeat A1C pending.  Discussed the importance of a healthy diet and regular exercise in order for weight loss, and to reduce the risk of further co-morbidity.  

## 2022-09-16 NOTE — Assessment & Plan Note (Signed)
Controlled.  Continue omeprazole 40 mg daily. 

## 2022-09-16 NOTE — Assessment & Plan Note (Signed)
Immunizations UTD. Pap smear UTD  Discussed the importance of a healthy diet and regular exercise in order for weight loss, and to reduce the risk of further co-morbidity.  Exam stable. Labs pending.  Follow up in 1 year for repeat physical.  

## 2022-09-16 NOTE — Progress Notes (Signed)
Subjective:    Patient ID: Jenna MaidensKatie K Mailhot, female    DOB: [birth year], 32 y.o.   MRN: [id number]  HPI  Jenna Bailey is a very pleasant 32 y.o. female who presents today for complete physical and follow up of chronic conditions.  She continues to experience daily headaches which last for a few hours. She will take Tylenol and Ibuprofen daily with improvement. She's had to use the Imitrex several times with successful abortion of migraine. She is compliant to Propranolol ER 80 mg nightly. Her anxiety has improved. She has failed Topamax previously.   Immunizations: -Tetanus: Completed in 2020  Diet: Fair diet.  Exercise: No regular exercise.  Eye exam: Completes annually  Dental exam: Completes semi-annually    Pap Smear: 2023  BP Readings from Last 3 Encounters:  09/16/22 118/76  09/12/21 126/78  06/27/21 (!) 155/96       Review of Systems  Constitutional:  Negative for unexpected weight change.  HENT:  Negative for rhinorrhea.   Respiratory:  Negative for cough and shortness of breath.   Cardiovascular:  Negative for chest pain.  Gastrointestinal:  Negative for constipation and diarrhea.  Genitourinary:  Negative for difficulty urinating.  Musculoskeletal:  Negative for arthralgias and myalgias.  Skin:  Negative for rash.  Allergic/Immunologic: Positive for environmental allergies.  Neurological:  Positive for headaches. Negative for dizziness.  Psychiatric/Behavioral:  The patient is not nervous/anxious.          Past Medical History:  Diagnosis Date   Abnormal uterine bleeding 08/25/2016   Chicken pox    GERD (gastroesophageal reflux disease)    Hypertriglyceridemia 03/07/2015   Morbid obesity 06/22/2013   Recurrent boils 07/26/2014   Scabies 02/11/2018   Vitamin D deficiency     Social History   Socioeconomic History   Marital status: Married    Spouse name: Not on file   Number of children: Not on file   Years of education: Not on file   Highest  education level: Not on file  Occupational History   Not on file  Tobacco Use   Smoking status: Never   Smokeless tobacco: Never  Vaping Use   Vaping Use: Never used  Substance and Sexual Activity   Alcohol use: No   Drug use: No   Sexual activity: Yes    Birth control/protection: I.U.D.  Other Topics Concern   Not on file  Social History Narrative   Married.   No children, undergoing fertility treatments.   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   Enjoys reading, traveling, watching TV.   Social Determinants of Health   Financial Resource Strain: Low Risk  (09/20/2018)   Overall Financial Resource Strain (CARDIA)    Difficulty of Paying Living Expenses: Not hard at all  Food Insecurity: No Food Insecurity (09/20/2018)   Hunger Vital Sign    Worried About Running Out of Food in the Last Year: Never true    Ran Out of Food in the Last Year: Never true  Transportation Needs: Unknown (09/20/2018)   PRAPARE - Administrator, Civil ServiceTransportation    Lack of Transportation (Medical): No    Lack of Transportation (Non-Medical): Not on file  Physical Activity: Not on file  Stress: No Stress Concern Present (09/20/2018)   [employer] - Occupational Stress Questionnaire    Feeling of Stress : Only a little  Social Connections: Not on file  Intimate Partner Violence: Not At Risk (09/20/2018)   Humiliation, Afraid, Rape, and Kick  questionnaire    Fear of Current or Ex-Partner: No    Emotionally Abused: No    Physically Abused: No    Sexually Abused: No    Past Surgical History:  Procedure Laterality Date   NO PAST SURGERIES     WISDOM TOOTH EXTRACTION      Family History  Problem Relation Age of Onset   Diabetes Paternal Grandmother    Depression Father    Alcohol abuse Father    Hypertension Mother     No Known Allergies  Current Outpatient Medications on File Prior to Visit  Medication Sig Dispense Refill   levonorgestrel (LILETTA, 52 MG,) 20.1 MCG/DAY IUD 1  each by Intrauterine route once.     omeprazole (PRILOSEC) 40 MG capsule TAKE 1 CAPSULE (40 MG TOTAL) BY MOUTH DAILY. FOR HEARTBURN. 90 capsule 0   sertraline (ZOLOFT) 50 MG tablet Take 1 tablet (50 mg total) by mouth daily. For anxiety. 90 tablet 3   No current facility-administered medications on file prior to visit.    BP 118/76   Pulse 76   Temp 97.6 F (36.4 C) (Temporal)   Ht 5\' 6"  (1.676 m)   Wt (!) 335 lb (152 kg)   SpO2 97%   BMI 54.07 kg/m  Objective:   Physical Exam HENT:     Right Ear: Tympanic membrane and ear canal normal.     Left Ear: Tympanic membrane and ear canal normal.     Nose: Nose normal.  Eyes:     Conjunctiva/sclera: Conjunctivae normal.     Pupils: Pupils are equal, round, and reactive to light.  Neck:     Thyroid: No thyromegaly.  Cardiovascular:     Rate and Rhythm: Normal rate and regular rhythm.     Heart sounds: No murmur heard. Pulmonary:     Effort: Pulmonary effort is normal.     Breath sounds: Normal breath sounds. No rales.  Abdominal:     General: Bowel sounds are normal.     Palpations: Abdomen is soft.     Tenderness: There is no abdominal tenderness.  Musculoskeletal:        General: Normal range of motion.     Cervical back: Neck supple.  Lymphadenopathy:     Cervical: No cervical adenopathy.  Skin:    General: Skin is warm and dry.     Findings: No rash.  Neurological:     Mental Status: She is alert and oriented to person, place, and time.     Cranial Nerves: No cranial nerve deficit.     Deep Tendon Reflexes: Reflexes are normal and symmetric.  Psychiatric:        Mood and Affect: Mood normal.           Assessment & Plan:  Encounter for annual general medical examination with abnormal findings in adult Assessment & Plan: Immunizations UTD. Pap smear UTD.  Discussed the importance of a healthy diet and regular exercise in order for weight loss, and to reduce the risk of further co-morbidity.  Exam  stable. Labs pending.  Follow up in 1 year for repeat physical.    Gastroesophageal reflux disease without esophagitis Assessment & Plan: Controlled.  Continue omeprazole 40 mg daily.   Frequent headaches Assessment & Plan: No improvement.  Stop propranolol ER 80 mg daily. Start amitriptyline  25 mg HS.  Continue sumatriptan 50 mg PRN.  Orders: -     Amitriptyline HCl; Take 1 tablet (25 mg total) by mouth at bedtime.  For headache prevention  Dispense: 90 tablet; Refill: 0 -     SUMAtriptan Succinate; Take 1 tablet by mouth at migraine onset. May repeat in 2 hours if headache persists or recurs.  Dispense: 10 tablet; Refill: 0  GAD (generalized anxiety disorder) Assessment & Plan: Controlled.   Continue sertraline 50 mg daily.    Hypertriglyceridemia Assessment & Plan: Repeat lipid panel pending.  Discussed the importance of a healthy diet and regular exercise in order for weight loss, and to reduce the risk of further co-morbidity.   Orders: -     Lipid panel  Prediabetes Assessment & Plan: Repeat A1C pending.  Discussed the importance of a healthy diet and regular exercise in order for weight loss, and to reduce the risk of further co-morbidity.   Orders: -     Hemoglobin A1c -     Comprehensive metabolic panel  Morbid obesity with BMI of 50.0-59.9, adult Assessment & Plan: Commended her on dietary changes and physical exercise! Encouraged to continue!  Discussed other options including Wegovy, Zepbound, Ozempic. She will check with her insurance first.          Doreene Nest, NP

## 2022-09-17 LAB — COMPREHENSIVE METABOLIC PANEL
ALT: 74 U/L — ABNORMAL HIGH (ref 0–35)
AST: 102 U/L — ABNORMAL HIGH (ref 0–37)
Albumin: 4.2 g/dL (ref 3.5–5.2)
Alkaline Phosphatase: 62 U/L (ref 39–117)
BUN: 9 mg/dL (ref 6–23)
CO2: 28 mEq/L (ref 19–32)
Calcium: 9.5 mg/dL (ref 8.4–10.5)
Chloride: 100 mEq/L (ref 96–112)
Creatinine, Ser: 0.68 mg/dL (ref 0.40–1.20)
GFR: 114.74 mL/min (ref >60.00)
Glucose, Bld: 78 mg/dL (ref 70–99)
Potassium: 4.5 mEq/L (ref 3.5–5.1)
Sodium: 137 mEq/L (ref 135–145)
Total Bilirubin: 0.4 mg/dL (ref 0.2–1.2)
Total Protein: 7.5 g/dL (ref 6.0–8.3)

## 2022-09-17 LAB — HEMOGLOBIN A1C: Hgb A1c MFr Bld: 6.2 % (ref 4.6–6.5)

## 2022-09-17 LAB — LIPID PANEL
Cholesterol: 161 mg/dL (ref 0–200)
HDL: 30 mg/dL — ABNORMAL LOW (ref >39.00)
NonHDL: 130.62
Total CHOL/HDL Ratio: 5
Triglycerides: 333 mg/dL — ABNORMAL HIGH (ref 0.0–149.0)
VLDL: 66.6 mg/dL — ABNORMAL HIGH (ref 0.0–40.0)

## 2022-09-17 LAB — LDL CHOLESTEROL, DIRECT: Direct LDL: 98 mg/dL

## 2022-11-22 ENCOUNTER — Other Ambulatory Visit: Payer: Self-pay | Admitting: Primary Care

## 2022-11-22 DIAGNOSIS — K219 Gastro-esophageal reflux disease without esophagitis: Secondary | ICD-10-CM

## 2022-12-06 ENCOUNTER — Other Ambulatory Visit: Payer: Self-pay | Admitting: Primary Care

## 2022-12-06 DIAGNOSIS — R519 Headache, unspecified: Secondary | ICD-10-CM

## 2023-06-01 ENCOUNTER — Telehealth: Payer: Self-pay | Admitting: Family Medicine

## 2023-06-01 DIAGNOSIS — J069 Acute upper respiratory infection, unspecified: Secondary | ICD-10-CM

## 2023-06-01 MED ORDER — IPRATROPIUM BROMIDE 0.03 % NA SOLN
2.0000 | Freq: Two times a day (BID) | NASAL | 0 refills | Status: DC
Start: 2023-06-01 — End: 2023-08-12

## 2023-06-01 MED ORDER — BENZONATATE 100 MG PO CAPS
100.0000 mg | ORAL_CAPSULE | Freq: Three times a day (TID) | ORAL | 0 refills | Status: DC | PRN
Start: 2023-06-01 — End: 2023-08-12

## 2023-06-01 MED ORDER — PROMETHAZINE-DM 6.25-15 MG/5ML PO SYRP: 5.0000 mL | ORAL_SOLUTION | Freq: Three times a day (TID) | ORAL | 0 refills | Status: DC | PRN

## 2023-06-01 NOTE — Addendum Note (Signed)
Addended by: Freddy Finner on: 06/01/2023 01:15 PM   Modules accepted: Orders

## 2023-06-01 NOTE — Progress Notes (Signed)
° °E-Visit for Upper Respiratory Infection  ° °We are sorry you are not feeling well.  Here is how we plan to help! ° °Based on what you have shared with me, it looks like you may have a viral upper respiratory infection.  Upper respiratory infections are caused by a large number of viruses; however, rhinovirus is the most common cause.  ° °Symptoms vary from person to person, with common symptoms including sore throat, cough, fatigue or lack of energy and feeling of general discomfort.  A low-grade fever of up to 100.4 may present, but is often uncommon.  Symptoms vary however, and are closely related to a person's age or underlying illnesses.  The most common symptoms associated with an upper respiratory infection are nasal discharge or congestion, cough, sneezing, headache and pressure in the ears and face.  These symptoms usually persist for about 3 to 10 days, but can last up to 2 weeks.  It is important to know that upper respiratory infections do not cause serious illness or complications in most cases.   ° °Upper respiratory infections can be transmitted from person to person, with the most common method of transmission being a person's hands.  The virus is able to live on the skin and can infect other persons for up to 2 hours after direct contact.  Also, these can be transmitted when someone coughs or sneezes; thus, it is important to cover the mouth to reduce this risk.  To keep the spread of the illness at bay, good hand hygiene is very important. ° °This is an infection that is most likely caused by a virus. There are no specific treatments other than to help you with the symptoms until the infection runs its course.  We are sorry you are not feeling well.  Here is how we plan to help! ° ° °For nasal congestion, you may use an oral decongestants such as Mucinex D or if you have glaucoma or high blood pressure use plain Mucinex.  Saline nasal spray or nasal drops can help and can safely be used as often  as needed for congestion.  For your congestion, I have prescribed Ipratropium Bromide nasal spray 0.03% two sprays in each nostril 2-3 times a day ° °If you do not have a history of heart disease, hypertension, diabetes or thyroid disease, prostate/bladder issues or glaucoma, you may also use Sudafed to treat nasal congestion.  It is highly recommended that you consult with a pharmacist or your primary care physician to ensure this medication is safe for you to take.    ° °If you have a cough, you may use cough suppressants such as Delsym and Robitussin.  If you have glaucoma or high blood pressure, you can also use Coricidin HBP.   °For cough I have prescribed for you A prescription cough medication called Tessalon Perles 100 mg. You may take 1-2 capsules every 8 hours as needed for cough ° °If you have a sore or scratchy throat, use a saltwater gargle- ¼ to ½ teaspoon of salt dissolved in a 4-ounce to 8-ounce glass of warm water.  Gargle the solution for approximately 15-30 seconds and then spit.  It is important not to swallow the solution.  You can also use throat lozenges/cough drops and Chloraseptic spray to help with throat pain or discomfort.  Warm or cold liquids can also be helpful in relieving throat pain. ° °For headache, pain or general discomfort, you can use Ibuprofen or Tylenol as directed.   °  Some authorities believe that zinc sprays or the use of Echinacea may shorten the course of your symptoms. ° ° °HOME CARE °Only take medications as instructed by your medical team. °Be sure to drink plenty of fluids. Water is fine as well as fruit juices, sodas and electrolyte beverages. You may want to stay away from caffeine or alcohol. If you are nauseated, try taking small sips of liquids. How do you know if you are getting enough fluid? Your urine should be a pale yellow or almost colorless. °Get rest. °Taking a steamy shower or using a humidifier may help nasal congestion and ease sore throat pain. You  can place a towel over your head and breathe in the steam from hot water coming from a faucet. °Using a saline nasal spray works much the same way. °Cough drops, hard candies and sore throat lozenges may ease your cough. °Avoid close contacts especially the very young and the elderly °Cover your mouth if you cough or sneeze °Always remember to wash your hands.  ° °GET HELP RIGHT AWAY IF: °You develop worsening fever. °If your symptoms do not improve within 10 days °You develop yellow or green discharge from your nose over 3 days. °You have coughing fits °You develop a severe head ache or visual changes. °You develop shortness of breath, difficulty breathing or start having chest pain °Your symptoms persist after you have completed your treatment plan ° °MAKE SURE YOU  °Understand these instructions. °Will watch your condition. °Will get help right away if you are not doing well or get worse. ° °Thank you for choosing an e-visit. ° °Your e-visit answers were reviewed by a board certified advanced clinical practitioner to complete your personal care plan. Depending upon the condition, your plan could have included both over the counter or prescription medications. ° °Please review your pharmacy choice. Make sure the pharmacy is open so you can pick up prescription now. If there is a problem, you may contact your provider through MyChart messaging and have the prescription routed to another pharmacy.  Your safety is important to us. If you have drug allergies check your prescription carefully.  ° °For the next 24 hours you can use MyChart to ask questions about today's visit, request a non-urgent call back, or ask for a work or school excuse. °You will get an email in the next two days asking about your experience. I hope that your e-visit has been valuable and will speed your recovery. ° ° ° °I provided 5 minutes of non face-to-face time during this encounter for chart review, medication and order placement, as well  as and documentation.  ° °

## 2023-07-23 ENCOUNTER — Telehealth: Payer: BC Managed Care – PPO | Admitting: Family Medicine

## 2023-07-23 DIAGNOSIS — J101 Influenza due to other identified influenza virus with other respiratory manifestations: Secondary | ICD-10-CM

## 2023-07-23 MED ORDER — OSELTAMIVIR PHOSPHATE 75 MG PO CAPS: 75.0000 mg | ORAL_CAPSULE | Freq: Two times a day (BID) | ORAL | 0 refills | Status: AC

## 2023-07-23 NOTE — Progress Notes (Signed)
E visit for Flu like symptoms   We are sorry that you are not feeling well.  Here is how we plan to help! Based on what you have shared with me it looks like you may have a respiratory virus that may be influenza.  Influenza or "the flu" is   an infection caused by a respiratory virus. The flu virus is highly contagious and persons who did not receive their yearly flu vaccination may "catch" the flu from close contact.  We have anti-viral medications to treat the viruses that cause this infection. They are not a "cure" and only shorten the course of the infection. These prescriptions are most effective when they are given within the first 2 days of "flu" symptoms. Antiviral medication are indicated if you have a high risk of complications from the flu. You should  also consider an antiviral medication if you are in close contact with someone who is at risk. These medications can help patients avoid complications from the flu  but have side effects that you should know. Possible side effects from Tamiflu or oseltamivir include nausea, vomiting, diarrhea, dizziness, headaches, eye redness, sleep problems or other respiratory symptoms. You should not take Tamiflu if you have an allergy to oseltamivir or any to the ingredients in Tamiflu.  Based upon your symptoms and potential risk factors I have prescribed Oseltamivir (Tamiflu).  It has been sent to your designated pharmacy.  You will take one 75 mg capsule orally twice a day for the next 5 days. and I recommend that you follow the flu symptoms recommendation that I have listed below.  ANYONE WHO HAS FLU SYMPTOMS SHOULD: Stay home. The flu is highly contagious and going out or to work exposes others! Be sure to drink plenty of fluids. Water is fine as well as fruit juices, sodas and electrolyte beverages. You may want to stay away from caffeine or alcohol. If you are nauseated, try taking small sips of liquids. How do you know if you are getting enough  fluid? Your urine should be a pale yellow or almost colorless. Get rest. Taking a steamy shower or using a humidifier may help nasal congestion and ease sore throat pain. Using a saline nasal spray works much the same way. Cough drops, hard candies and sore throat lozenges may ease your cough. Line up a caregiver. Have someone check on you regularly.   GET HELP RIGHT AWAY IF: You cannot keep down liquids or your medications. You become short of breath Your fell like you are going to pass out or loose consciousness. Your symptoms persist after you have completed your treatment plan MAKE SURE YOU  Understand these instructions. Will watch your condition. Will get help right away if you are not doing well or get worse.  Your e-visit answers were reviewed by a board certified advanced clinical practitioner to complete your personal care plan.  Depending on the condition, your plan could have included both over the counter or prescription medications.  If there is a problem please reply  once you have received a response from your provider.  Your safety is important to [place].  If you have drug allergies check your prescription carefully.    You can use MyChart to ask questions about today's visit, request a non-urgent call back, or ask for a work or school excuse for 24 hours related to this e-Visit. If it has been greater than 24 hours you will need to follow up with your provider, or enter a  new e-Visit to address those concerns.  You will get an e-mail in the next two days asking about your experience.  I hope that your e-visit has been valuable and will speed your recovery. Thank you for using e-visits.  I provided 5 minutes of non face-to-face time during this encounter for chart review, medication and order placement, as well as and documentation.

## 2023-08-11 DIAGNOSIS — R519 Headache, unspecified: Secondary | ICD-10-CM

## 2023-08-11 DIAGNOSIS — F411 Generalized anxiety disorder: Secondary | ICD-10-CM

## 2023-08-11 DIAGNOSIS — K219 Gastro-esophageal reflux disease without esophagitis: Secondary | ICD-10-CM

## 2023-08-12 MED ORDER — SERTRALINE HCL 50 MG PO TABS: 50.0000 mg | ORAL_TABLET | Freq: Every day | ORAL | 0 refills | Status: DC

## 2023-08-12 MED ORDER — AMITRIPTYLINE HCL 25 MG PO TABS: 25.0000 mg | ORAL_TABLET | Freq: Every day | ORAL | 0 refills | Status: DC

## 2023-08-12 MED ORDER — OMEPRAZOLE 40 MG PO CPDR: 40.0000 mg | DELAYED_RELEASE_CAPSULE | Freq: Every day | ORAL | 0 refills | Status: DC

## 2023-08-18 ENCOUNTER — Other Ambulatory Visit: Payer: Self-pay | Admitting: Primary Care

## 2023-08-18 DIAGNOSIS — K219 Gastro-esophageal reflux disease without esophagitis: Secondary | ICD-10-CM

## 2023-08-30 ENCOUNTER — Other Ambulatory Visit: Payer: Self-pay | Admitting: Primary Care

## 2023-08-30 DIAGNOSIS — R519 Headache, unspecified: Secondary | ICD-10-CM

## 2023-09-15 ENCOUNTER — Telehealth: Payer: Self-pay | Admitting: Primary Care

## 2023-09-15 NOTE — Telephone Encounter (Signed)
 Per patient request my chart has been sent with information.

## 2023-09-15 NOTE — Telephone Encounter (Signed)
 Copied from CRM [id number]. Topic: General - Other >> Sep 15, 2023  1:45 PM [place] E wrote: Reason for CRM: Patient called regarding her physical appointment that is on April 10th, questioning if she has to fast for this appointment. Patient stated that staff can respond through MyChart if that is easier.

## 2023-09-17 ENCOUNTER — Ambulatory Visit (INDEPENDENT_AMBULATORY_CARE_PROVIDER_SITE_OTHER): Admitting: Primary Care

## 2023-09-17 VITALS — BP 126/70 | HR 95 | Temp 97.4°F | Ht 66.0 in | Wt 344.0 lb

## 2023-09-17 DIAGNOSIS — K219 Gastro-esophageal reflux disease without esophagitis: Secondary | ICD-10-CM

## 2023-09-17 DIAGNOSIS — F411 Generalized anxiety disorder: Secondary | ICD-10-CM

## 2023-09-17 DIAGNOSIS — R7303 Prediabetes: Secondary | ICD-10-CM

## 2023-09-17 DIAGNOSIS — R1011 Right upper quadrant pain: Secondary | ICD-10-CM | POA: Diagnosis not present

## 2023-09-17 DIAGNOSIS — R1012 Left upper quadrant pain: Secondary | ICD-10-CM | POA: Diagnosis not present

## 2023-09-17 DIAGNOSIS — Z0001 Encounter for general adult medical examination with abnormal findings: Secondary | ICD-10-CM | POA: Diagnosis not present

## 2023-09-17 DIAGNOSIS — R519 Headache, unspecified: Secondary | ICD-10-CM

## 2023-09-17 DIAGNOSIS — E781 Pure hyperglyceridemia: Secondary | ICD-10-CM

## 2023-09-17 DIAGNOSIS — G8929 Other chronic pain: Secondary | ICD-10-CM | POA: Diagnosis not present

## 2023-09-17 LAB — COMPREHENSIVE METABOLIC PANEL WITH GFR
ALT: 155 U/L — ABNORMAL HIGH (ref 0–35)
AST: 250 U/L — ABNORMAL HIGH (ref 0–37)
Albumin: 4.3 g/dL (ref 3.5–5.2)
Alkaline Phosphatase: 93 U/L (ref 39–117)
BUN: 11 mg/dL (ref 6–23)
CO2: 29 meq/L (ref 19–32)
Calcium: 9.5 mg/dL (ref 8.4–10.5)
Chloride: 99 meq/L (ref 96–112)
Creatinine, Ser: 0.66 mg/dL (ref 0.40–1.20)
GFR: 114.76 mL/min (ref >60.00)
Glucose, Bld: 108 mg/dL — ABNORMAL HIGH (ref 70–99)
Potassium: 4.1 meq/L (ref 3.5–5.1)
Sodium: 137 meq/L (ref 135–145)
Total Bilirubin: 0.6 mg/dL (ref 0.2–1.2)
Total Protein: 7.9 g/dL (ref 6.0–8.3)

## 2023-09-17 LAB — CBC WITH DIFFERENTIAL/PLATELET
Basophils Absolute: 0 10*3/uL (ref 0.0–0.1)
Basophils Relative: 0.3 % (ref 0.0–3.0)
Eosinophils Absolute: 0.2 10*3/uL (ref 0.0–0.7)
Eosinophils Relative: 1.7 % (ref 0.0–5.0)
HCT: 36.6 % (ref 36.0–46.0)
Hemoglobin: 11.9 g/dL — ABNORMAL LOW (ref 12.0–15.0)
Lymphocytes Relative: 29.4 % (ref 12.0–46.0)
Lymphs Abs: 2.8 10*3/uL (ref 0.7–4.0)
MCHC: 32.6 g/dL (ref 30.0–36.0)
MCV: 81.1 fl (ref 78.0–100.0)
Monocytes Absolute: 0.6 10*3/uL (ref 0.1–1.0)
Monocytes Relative: 5.8 % (ref 3.0–12.0)
Neutro Abs: 6 10*3/uL (ref 1.4–7.7)
Neutrophils Relative %: 62.8 % (ref 43.0–77.0)
Platelets: 316 10*3/uL (ref 150.0–400.0)
RBC: 4.52 Mil/uL (ref 3.87–5.11)
RDW: 16 % — ABNORMAL HIGH (ref 11.5–15.5)
WBC: 9.5 10*3/uL (ref 4.0–10.5)

## 2023-09-17 LAB — LIPID PANEL
Cholesterol: 179 mg/dL (ref 0–200)
HDL: 31.6 mg/dL — ABNORMAL LOW (ref >39.00)
LDL Cholesterol: 121 mg/dL — ABNORMAL HIGH (ref 0–99)
NonHDL: 146.91
Total CHOL/HDL Ratio: 6
Triglycerides: 129 mg/dL (ref 0.0–149.0)
VLDL: 25.8 mg/dL (ref 0.0–40.0)

## 2023-09-17 LAB — HEMOGLOBIN A1C: Hgb A1c MFr Bld: 6.4 % (ref 4.6–6.5)

## 2023-09-17 LAB — LIPASE: Lipase: 19 U/L (ref 11.0–59.0)

## 2023-09-17 NOTE — Progress Notes (Signed)
 Subjective:    Patient ID: Jenna Bailey, female    DOB: [birth year], 33 y.o.   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 female who presents today for complete physical and follow up of chronic conditions.  She would also like to discuss abdominal pain. Her pain is located to the bilateral upper abdomen which initially began around November 2024. Her pain is constant for which she describes as achy. Her pain is not worse with eating or drinking. She denies constipation, diarrhea, rectal bleeding, nausea or vomiting.   Immunizations: -Tetanus: Completed in 2020   Diet: Fair diet.  Exercise: No regular exercise.  Eye exam: Completes annually  Dental exam: Completes semi-annually    Pap Smear: Completed in 2023  BP Readings from Last 3 Encounters:  09/17/23 126/70  09/16/22 118/76  09/12/21 126/78       Review of Systems  Constitutional:  Negative for unexpected weight change.  HENT:  Negative for rhinorrhea.   Respiratory:  Negative for cough and shortness of breath.   Cardiovascular:  Negative for chest pain.  Gastrointestinal:  Positive for abdominal pain. Negative for constipation and diarrhea.  Genitourinary:  Negative for difficulty urinating and menstrual problem.  Musculoskeletal:  Negative for arthralgias and myalgias.  Skin:  Negative for rash.  Allergic/Immunologic: Negative for environmental allergies.  Neurological:  Negative for dizziness and headaches.  Psychiatric/Behavioral:  The patient is not nervous/anxious.          Past Medical History:  Diagnosis Date   Abnormal uterine bleeding 08/25/2016   Chicken pox    GERD (gastroesophageal reflux disease)    Hypertriglyceridemia 03/07/2015   Morbid obesity (HCC) 06/22/2013   Recurrent boils 07/26/2014   Scabies 02/11/2018   Vitamin D deficiency     Social History   Socioeconomic History   Marital status: Married    Spouse name: Not on file   Number of children: Not on file    Years of education: Not on file   Highest education level: Associate degree: occupational, Scientist, product/process development, or vocational program  Occupational History   Not on file  Tobacco Use   Smoking status: Never   Smokeless tobacco: Never  Vaping Use   Vaping status: Never Used  Substance and Sexual Activity   Alcohol use: No   Drug use: No   Sexual activity: Yes    Birth control/protection: I.U.D.  Other Topics Concern   Not on file  Social History Narrative   Married.   No children, undergoing fertility treatments.   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   Enjoys reading, traveling, watching TV.   Social Drivers of Health   Financial Resource Strain: Medium Risk (09/17/2023)   Overall Financial Resource Strain (CARDIA)    Difficulty of Paying Living Expenses: Somewhat hard  Food Insecurity: No Food Insecurity (09/17/2023)   Hunger Vital Sign    Worried About Running Out of Food in the Last Year: Never true    Ran Out of Food in the Last Year: Never true  Transportation Needs: No Transportation Needs (09/17/2023)   PRAPARE - Administrator, Civil Service (Medical): No    Lack of Transportation (Non-Medical): No  Physical Activity: Insufficiently Active (09/17/2023)   Exercise Vital Sign    Days of Exercise per Week: 2 days    Minutes of Exercise per Session: 20 min  Stress: Stress Concern Present (09/17/2023)   [employer] of Occupational Health - Occupational  Stress Questionnaire    Feeling of Stress : To some extent  Social Connections: Moderately Isolated (09/17/2023)   Social Connection and Isolation Panel [NHANES]    Frequency of Communication with Friends and Family: Once a week    Frequency of Social Gatherings with Friends and Family: Once a week    Attends Religious Services: 1 to 4 times per year    Active Member of [employer] or Organizations: No    Attends Engineer, structural: Not on file    Marital Status: Married  Catering manager Violence: Not At  Risk (09/20/2018)   Humiliation, Afraid, Rape, and Kick questionnaire    Fear of Current or Ex-Partner: No    Emotionally Abused: No    Physically Abused: No    Sexually Abused: No    Past Surgical History:  Procedure Laterality Date   NO PAST SURGERIES     WISDOM TOOTH EXTRACTION      Family History  Problem Relation Age of Onset   Diabetes Paternal Grandmother    Depression Father    Alcohol abuse Father    Hypertension Mother     No Known Allergies  Current Outpatient Medications on File Prior to Visit  Medication Sig Dispense Refill   amitriptyline (ELAVIL) 25 MG tablet Take 1 tablet (25 mg total) by mouth at bedtime. For headache prevention 90 tablet 0   levonorgestrel (LILETTA, 52 MG,) 20.1 MCG/DAY IUD 1 each by Intrauterine route once.     omeprazole (PRILOSEC) 40 MG capsule Take 1 capsule (40 mg total) by mouth daily. For heartburn. 90 capsule 0   sertraline (ZOLOFT) 50 MG tablet Take 1 tablet (50 mg total) by mouth daily. For anxiety. 90 tablet 0   SUMAtriptan (IMITREX) 50 MG tablet Take 1 tablet by mouth at migraine onset. May repeat in 2 hours if headache persists or recurs. 10 tablet 0   No current facility-administered medications on file prior to visit.    BP 126/70   Pulse 95   Temp (!) 97.4 F (36.3 C) (Temporal)   Ht 5\' 6"  (1.676 m)   Wt (!) 344 lb (156 kg)   SpO2 96%   BMI 55.52 kg/m  Objective:   Physical Exam HENT:     Right Ear: Tympanic membrane and ear canal normal.     Left Ear: Tympanic membrane and ear canal normal.  Eyes:     Pupils: Pupils are equal, round, and reactive to light.  Cardiovascular:     Rate and Rhythm: Normal rate and regular rhythm.  Pulmonary:     Effort: Pulmonary effort is normal.     Breath sounds: Normal breath sounds.  Abdominal:     General: Bowel sounds are normal.     Palpations: Abdomen is soft.     Tenderness: There is abdominal tenderness in the epigastric area.  Musculoskeletal:        General: Normal  range of motion.     Cervical back: Neck supple.  Skin:    General: Skin is warm and dry.  Neurological:     Mental Status: She is alert and oriented to person, place, and time.     Cranial Nerves: No cranial nerve deficit.     Deep Tendon Reflexes:     Reflex Scores:      Patellar reflexes are 2+ on the right side and 2+ on the left side. Psychiatric:        Mood and Affect: Mood normal.  Assessment & Plan:  Encounter for annual general medical examination with abnormal findings in adult Assessment & Plan: Immunizations UTD. Pap smear UTD.  Discussed the importance of a healthy diet and regular exercise in order for weight loss, and to reduce the risk of further co-morbidity.  Exam stable. Labs pending.  Follow up in 1 year for repeat physical.    Gastroesophageal reflux disease without esophagitis Assessment & Plan: Controlled.  Continue omeprazole 40 mg daily.    Frequent headaches Assessment & Plan: Well controlled.   Continue amitriptyline 25 mg daily.  Continue  sumatriptan 50 mg PRN.     GAD (generalized anxiety disorder) Assessment & Plan: Controlled.  Continue sertraline 50 mg daily. Continue to monitor.   Hypertriglyceridemia Assessment & Plan: Repeat lipid panel pending.  Orders: -     Lipid panel  Prediabetes Assessment & Plan: Repeat A1c pending.  Orders: -     Hemoglobin A1c  Chronic bilateral upper abdominal pain Assessment & Plan: Differentials include cholelithiasis, pancreatitis, hiatal hernia, H. pylori. Exam today benign and reassuring.  Labs pending today including H. pylori, lipase, CBC with differential, CMP. Abdominal ultrasound ordered and pending.   Orders: -     CBC with Differential/Platelet -     Comprehensive metabolic panel with GFR -     Lipase -     US Abdomen Complete; Future -     H. pylori breath test        Doreene Nest, NP

## 2023-09-17 NOTE — Assessment & Plan Note (Signed)
 Repeat A1c pending

## 2023-09-17 NOTE — Assessment & Plan Note (Signed)
 Well controlled.   Continue amitriptyline 25 mg daily.  Continue  sumatriptan 50 mg PRN.

## 2023-09-17 NOTE — Assessment & Plan Note (Signed)
Controlled.  Continue sertraline 50 mg daily. Continue to monitor.  

## 2023-09-17 NOTE — Assessment & Plan Note (Signed)
 Controlled.  Continue omeprazole 40 mg daily.

## 2023-09-17 NOTE — Assessment & Plan Note (Signed)
 Differentials include cholelithiasis, pancreatitis, hiatal hernia, H. pylori. Exam today benign and reassuring.  Labs pending today including H. pylori, lipase, CBC with differential, CMP. Abdominal ultrasound ordered and pending.

## 2023-09-17 NOTE — Assessment & Plan Note (Signed)
Immunizations UTD. Pap smear UTD  Discussed the importance of a healthy diet and regular exercise in order for weight loss, and to reduce the risk of further co-morbidity.  Exam stable. Labs pending.  Follow up in 1 year for repeat physical.  

## 2023-09-17 NOTE — Assessment & Plan Note (Signed)
 Repeat lipid panel pending.

## 2023-09-17 NOTE — Patient Instructions (Signed)
 Stop by the lab prior to leaving today. I will notify you of your results once received.   You will receive a phone call regarding the ultrasound.  It was a pleasure to see you today!

## 2023-09-18 ENCOUNTER — Other Ambulatory Visit: Payer: Self-pay | Admitting: Primary Care

## 2023-09-18 DIAGNOSIS — R7989 Other specified abnormal findings of blood chemistry: Secondary | ICD-10-CM

## 2023-09-18 DIAGNOSIS — G8929 Other chronic pain: Secondary | ICD-10-CM

## 2023-09-18 LAB — H. PYLORI BREATH TEST: H. pylori Breath Test: NOT DETECTED

## 2023-09-20 ENCOUNTER — Other Ambulatory Visit: Payer: Self-pay | Admitting: Primary Care

## 2023-09-20 DIAGNOSIS — F411 Generalized anxiety disorder: Secondary | ICD-10-CM

## 2023-09-21 ENCOUNTER — Ambulatory Visit
Admission: RE | Admit: 2023-09-21 | Discharge: 2023-09-21 | Disposition: A | Discharge status: Home / Self Care | Source: Ambulatory Visit | Attending: Primary Care | Admitting: Primary Care

## 2023-09-21 DIAGNOSIS — G8929 Other chronic pain: Secondary | ICD-10-CM

## 2023-09-21 DIAGNOSIS — R7989 Other specified abnormal findings of blood chemistry: Secondary | ICD-10-CM | POA: Insufficient documentation

## 2023-09-21 DIAGNOSIS — R1012 Left upper quadrant pain: Secondary | ICD-10-CM | POA: Diagnosis present

## 2023-09-21 DIAGNOSIS — R1011 Right upper quadrant pain: Secondary | ICD-10-CM | POA: Diagnosis present

## 2023-09-21 DIAGNOSIS — K760 Fatty (change of) liver, not elsewhere classified: Secondary | ICD-10-CM

## 2023-09-22 ENCOUNTER — Encounter: Payer: Self-pay | Admitting: *Deleted

## 2023-09-23 ENCOUNTER — Ambulatory Visit
Admission: RE | Admit: 2023-09-23 | Discharge: 2023-09-23 | Disposition: A | Discharge status: Home / Self Care | Source: Ambulatory Visit | Attending: Primary Care | Admitting: Primary Care

## 2023-09-23 DIAGNOSIS — G8929 Other chronic pain: Secondary | ICD-10-CM

## 2023-09-23 DIAGNOSIS — R1011 Right upper quadrant pain: Secondary | ICD-10-CM | POA: Insufficient documentation

## 2023-09-23 DIAGNOSIS — K760 Fatty (change of) liver, not elsewhere classified: Secondary | ICD-10-CM

## 2023-09-23 DIAGNOSIS — R7989 Other specified abnormal findings of blood chemistry: Secondary | ICD-10-CM

## 2023-09-23 DIAGNOSIS — R1012 Left upper quadrant pain: Secondary | ICD-10-CM | POA: Diagnosis present

## 2023-09-23 MED ORDER — IOHEXOL 300 MG/ML  SOLN
100.0000 mL | Freq: Once | INTRAMUSCULAR | Status: AC | PRN
  Administered 2023-09-23: 100 mL via INTRAVENOUS

## 2023-09-29 ENCOUNTER — Other Ambulatory Visit

## 2023-10-02 ENCOUNTER — Other Ambulatory Visit (INDEPENDENT_AMBULATORY_CARE_PROVIDER_SITE_OTHER)

## 2023-10-02 DIAGNOSIS — R7989 Other specified abnormal findings of blood chemistry: Secondary | ICD-10-CM

## 2023-10-02 LAB — MONONUCLEOSIS SCREEN: Mono Screen: NEGATIVE

## 2023-10-06 DIAGNOSIS — R748 Abnormal levels of other serum enzymes: Secondary | ICD-10-CM

## 2023-10-06 DIAGNOSIS — R7989 Other specified abnormal findings of blood chemistry: Secondary | ICD-10-CM

## 2023-10-06 DIAGNOSIS — G8929 Other chronic pain: Secondary | ICD-10-CM

## 2023-10-07 LAB — TEST AUTHORIZATION

## 2023-10-07 LAB — GAMMA GT: GGT: 102 U/L — ABNORMAL HIGH (ref 3–50)

## 2023-10-07 LAB — HEPATITIS PANEL, ACUTE
Hep A IgM: NONREACTIVE
Hep B C IgM: NONREACTIVE
Hepatitis B Surface Ag: NONREACTIVE
Hepatitis C Ab: NONREACTIVE

## 2023-11-05 ENCOUNTER — Other Ambulatory Visit: Payer: Self-pay | Admitting: Primary Care

## 2023-11-05 DIAGNOSIS — R519 Headache, unspecified: Secondary | ICD-10-CM

## 2023-11-05 DIAGNOSIS — K219 Gastro-esophageal reflux disease without esophagitis: Secondary | ICD-10-CM

## 2023-11-05 DIAGNOSIS — F411 Generalized anxiety disorder: Secondary | ICD-10-CM

## 2023-12-25 ENCOUNTER — Encounter: Payer: Self-pay | Admitting: Advanced Practice Midwife

## 2024-01-20 ENCOUNTER — Encounter: Payer: Self-pay | Admitting: Obstetrics and Gynecology

## 2024-03-23 DIAGNOSIS — R519 Headache, unspecified: Secondary | ICD-10-CM

## 2024-03-23 MED ORDER — SUMATRIPTAN SUCCINATE 50 MG PO TABS: ORAL_TABLET | ORAL | 0 refills | Status: AC

## 2024-06-16 ENCOUNTER — Telehealth: Admitting: Nurse Practitioner

## 2024-06-16 DIAGNOSIS — J40 Bronchitis, not specified as acute or chronic: Secondary | ICD-10-CM | POA: Diagnosis not present

## 2024-06-16 MED ORDER — ALBUTEROL SULFATE HFA 108 (90 BASE) MCG/ACT IN AERS: 2.0000 | INHALATION_SPRAY | Freq: Four times a day (QID) | RESPIRATORY_TRACT | 0 refills | Status: AC | PRN

## 2024-06-16 MED ORDER — BENZONATATE 100 MG PO CAPS: 100.0000 mg | ORAL_CAPSULE | Freq: Three times a day (TID) | ORAL | 0 refills | Status: AC | PRN

## 2024-06-16 MED ORDER — DOXYCYCLINE HYCLATE 100 MG PO TABS: 100.0000 mg | ORAL_TABLET | Freq: Two times a day (BID) | ORAL | 0 refills | Status: AC

## 2024-06-16 NOTE — Progress Notes (Signed)
 We are sorry that you are not feeling well.  Here is how we plan to help!  Based on your presentation I believe you most likely have A cough due to bacteria.  When patients have a fever and a productive cough with a change in color or increased sputum production, we are concerned about bacterial bronchitis.  If left untreated it can progress to pneumonia.  If your symptoms do not improve with your treatment plan it is important that you contact your provider.   I have prescribed Doxycycline  100 mg twice a day for 7 days     In addition you may use A prescription cough medication called Tessalon  Perles 100mg . You may take 1-2 capsules every 8 hours as needed for your cough.  We will also prescribe an inhaler to use as needed for wheezing   Meds ordered this encounter  Medications   doxycycline  (VIBRA -TABS) 100 MG tablet    Sig: Take 1 tablet (100 mg total) by mouth 2 (two) times daily for 7 days.    Dispense:  14 tablet    Refill:  0   benzonatate  (TESSALON ) 100 MG capsule    Sig: Take 1 capsule (100 mg total) by mouth 3 (three) times daily as needed.    Dispense:  30 capsule    Refill:  0   albuterol  (VENTOLIN  HFA) 108 (90 Base) MCG/ACT inhaler    Sig: Inhale 2 puffs into the lungs every 6 (six) hours as needed for wheezing or shortness of breath.    Dispense:  8 g    Refill:  0     From your responses in the eVisit questionnaire you describe inflammation in the upper respiratory tract which is causing a significant cough.  This is commonly called Bronchitis and has four common causes:   Allergies Viral Infections Acid Reflux Bacterial Infection Allergies, viruses and acid reflux are treated by controlling symptoms or eliminating the cause. An example might be a cough caused by taking certain blood pressure medications. You stop the cough by changing the medication. Another example might be a cough caused by acid reflux. Controlling the reflux helps control the cough.  USE OF  BRONCHODILATOR (RESCUE) INHALERS: There is a risk from using your bronchodilator too frequently.  The risk is that over-reliance on a medication which only relaxes the muscles surrounding the breathing tubes can reduce the effectiveness of medications prescribed to reduce swelling and congestion of the tubes themselves.  Although you feel brief relief from the bronchodilator inhaler, your asthma may actually be worsening with the tubes becoming more swollen and filled with mucus.  This can delay other crucial treatments, such as oral steroid medications. If you need to use a bronchodilator inhaler daily, several times per day, you should discuss this with your provider.  There are probably better treatments that could be used to keep your asthma under control.     HOME CARE Only take medications as instructed by your medical team. Complete the entire course of an antibiotic. Drink plenty of fluids and get plenty of rest. Avoid close contacts especially the very young and the elderly Cover your mouth if you cough or cough into your sleeve. Always remember to wash your hands A steam or ultrasonic humidifier can help congestion.   GET HELP RIGHT AWAY IF: You develop worsening fever. You become short of breath You cough up blood. Your symptoms persist after you have completed your treatment plan MAKE SURE YOU  Understand these instructions. Will watch  your condition. Will get help right away if you are not doing well or get worse.  Your e-visit answers were reviewed by a board certified advanced clinical practitioner to complete your personal care plan.  Depending on the condition, your plan could have included both over the counter or prescription medications. If there is a problem please reply  once you have received a response from your provider. Your safety is important to us .  If you have drug allergies check your prescription carefully.    You can use MyChart to ask questions about  todays visit, request a non-urgent call back, or ask for a work or school excuse for 24 hours related to this e-Visit. If it has been greater than 24 hours you will need to follow up with your provider, or enter a new e-Visit to address those concerns. You will get an e-mail in the next two days asking about your experience.  I hope that your e-visit has been valuable and will speed your recovery. Thank you for using e-visits.   I have spent 5 minutes in review of e-visit questionnaire, review and updating patient chart, medical decision making and response to patient.   Lauraine Kitty, FNP
